# Patient Record
Sex: Female | Born: 1975 | Race: White | Hispanic: No | Smoking: Current every day smoker
Health system: Southern US, Community
[De-identification: ages and names within clinical notes are randomized; demographics above are authoritative.]

## PROBLEM LIST (undated history)

## (undated) DIAGNOSIS — N200 Calculus of kidney: Secondary | ICD-10-CM

## (undated) DIAGNOSIS — F32A Depression, unspecified: Secondary | ICD-10-CM

## (undated) DIAGNOSIS — E119 Type 2 diabetes mellitus without complications: Secondary | ICD-10-CM

## (undated) DIAGNOSIS — F329 Major depressive disorder, single episode, unspecified: Secondary | ICD-10-CM

## (undated) DIAGNOSIS — J42 Unspecified chronic bronchitis: Secondary | ICD-10-CM

## (undated) DIAGNOSIS — F319 Bipolar disorder, unspecified: Secondary | ICD-10-CM

## (undated) DIAGNOSIS — I10 Essential (primary) hypertension: Secondary | ICD-10-CM

## (undated) DIAGNOSIS — G40909 Epilepsy, unspecified, not intractable, without status epilepticus: Secondary | ICD-10-CM

## (undated) DIAGNOSIS — F419 Anxiety disorder, unspecified: Secondary | ICD-10-CM

## (undated) HISTORY — PX: TUBAL LIGATION: SHX77

---

## 1997-11-03 ENCOUNTER — Other Ambulatory Visit: Admission: RE | Admit: 1997-11-03 | Discharge: 1997-11-03 | Payer: Self-pay | Admitting: Obstetrics & Gynecology

## 1997-12-12 ENCOUNTER — Ambulatory Visit (HOSPITAL_COMMUNITY): Admission: RE | Admit: 1997-12-12 | Discharge: 1997-12-12 | Payer: Self-pay | Admitting: *Deleted

## 1998-01-24 ENCOUNTER — Other Ambulatory Visit: Admission: RE | Admit: 1998-01-24 | Discharge: 1998-01-24 | Payer: Self-pay | Admitting: Obstetrics and Gynecology

## 1998-02-17 ENCOUNTER — Inpatient Hospital Stay (HOSPITAL_COMMUNITY): Admission: AD | Admit: 1998-02-17 | Discharge: 1998-02-17 | Payer: Self-pay | Admitting: Obstetrics & Gynecology

## 1998-02-21 ENCOUNTER — Inpatient Hospital Stay (HOSPITAL_COMMUNITY): Admission: AD | Admit: 1998-02-21 | Discharge: 1998-02-24 | Payer: Self-pay | Admitting: Obstetrics & Gynecology

## 1998-04-06 ENCOUNTER — Other Ambulatory Visit: Admission: RE | Admit: 1998-04-06 | Discharge: 1998-04-06 | Payer: Self-pay | Admitting: Obstetrics & Gynecology

## 1998-09-02 ENCOUNTER — Emergency Department (HOSPITAL_COMMUNITY): Admission: EM | Admit: 1998-09-02 | Discharge: 1998-09-02 | Payer: Self-pay | Admitting: Emergency Medicine

## 1998-09-03 ENCOUNTER — Encounter: Payer: Self-pay | Admitting: Emergency Medicine

## 1998-11-14 ENCOUNTER — Emergency Department (HOSPITAL_COMMUNITY): Admission: EM | Admit: 1998-11-14 | Discharge: 1998-11-14 | Payer: Self-pay | Admitting: Emergency Medicine

## 1999-01-21 ENCOUNTER — Inpatient Hospital Stay (HOSPITAL_COMMUNITY): Admission: AD | Admit: 1999-01-21 | Discharge: 1999-01-21 | Payer: Self-pay | Admitting: Obstetrics & Gynecology

## 1999-01-21 ENCOUNTER — Encounter: Payer: Self-pay | Admitting: Obstetrics

## 1999-03-21 ENCOUNTER — Emergency Department (HOSPITAL_COMMUNITY): Admission: EM | Admit: 1999-03-21 | Discharge: 1999-03-21 | Payer: Self-pay | Admitting: Emergency Medicine

## 1999-04-20 ENCOUNTER — Inpatient Hospital Stay (HOSPITAL_COMMUNITY): Admission: AD | Admit: 1999-04-20 | Discharge: 1999-04-20 | Payer: Self-pay | Admitting: Obstetrics and Gynecology

## 1999-04-20 ENCOUNTER — Encounter: Payer: Self-pay | Admitting: Obstetrics and Gynecology

## 1999-07-29 ENCOUNTER — Inpatient Hospital Stay (HOSPITAL_COMMUNITY): Admission: AD | Admit: 1999-07-29 | Discharge: 1999-08-01 | Payer: Self-pay | Admitting: Obstetrics and Gynecology

## 1999-07-30 ENCOUNTER — Encounter: Payer: Self-pay | Admitting: Obstetrics and Gynecology

## 1999-08-03 ENCOUNTER — Inpatient Hospital Stay (HOSPITAL_COMMUNITY): Admission: AD | Admit: 1999-08-03 | Discharge: 1999-08-03 | Payer: Self-pay | Admitting: Obstetrics & Gynecology

## 1999-08-09 ENCOUNTER — Inpatient Hospital Stay (HOSPITAL_COMMUNITY): Admission: AD | Admit: 1999-08-09 | Discharge: 1999-08-09 | Payer: Self-pay | Admitting: Obstetrics & Gynecology

## 1999-08-20 ENCOUNTER — Inpatient Hospital Stay (HOSPITAL_COMMUNITY): Admission: AD | Admit: 1999-08-20 | Discharge: 1999-08-20 | Payer: Self-pay | Admitting: Obstetrics and Gynecology

## 1999-08-27 ENCOUNTER — Inpatient Hospital Stay (HOSPITAL_COMMUNITY): Admission: AD | Admit: 1999-08-27 | Discharge: 1999-08-30 | Payer: Self-pay | Admitting: Obstetrics and Gynecology

## 1999-11-27 ENCOUNTER — Emergency Department (HOSPITAL_COMMUNITY): Admission: EM | Admit: 1999-11-27 | Discharge: 1999-11-27 | Payer: Self-pay | Admitting: Emergency Medicine

## 2000-03-15 ENCOUNTER — Emergency Department (HOSPITAL_COMMUNITY): Admission: EM | Admit: 2000-03-15 | Discharge: 2000-03-15 | Payer: Self-pay | Admitting: Emergency Medicine

## 2000-03-17 ENCOUNTER — Emergency Department (HOSPITAL_COMMUNITY): Admission: EM | Admit: 2000-03-17 | Discharge: 2000-03-17 | Payer: Self-pay | Admitting: Emergency Medicine

## 2000-04-19 ENCOUNTER — Emergency Department (HOSPITAL_COMMUNITY): Admission: EM | Admit: 2000-04-19 | Discharge: 2000-04-19 | Payer: Self-pay | Admitting: Emergency Medicine

## 2000-11-25 ENCOUNTER — Inpatient Hospital Stay (HOSPITAL_COMMUNITY): Admission: AD | Admit: 2000-11-25 | Discharge: 2000-11-25 | Payer: Self-pay | Admitting: Obstetrics

## 2001-08-08 ENCOUNTER — Emergency Department (HOSPITAL_COMMUNITY): Admission: EM | Admit: 2001-08-08 | Discharge: 2001-08-08 | Payer: Self-pay | Admitting: Emergency Medicine

## 2002-02-27 ENCOUNTER — Inpatient Hospital Stay (HOSPITAL_COMMUNITY): Admission: AD | Admit: 2002-02-27 | Discharge: 2002-02-27 | Payer: Self-pay | Admitting: *Deleted

## 2002-02-27 ENCOUNTER — Encounter: Payer: Self-pay | Admitting: Family Medicine

## 2002-06-09 ENCOUNTER — Inpatient Hospital Stay (HOSPITAL_COMMUNITY): Admission: EM | Admit: 2002-06-09 | Discharge: 2002-06-12 | Payer: Self-pay | Admitting: Psychiatry

## 2002-06-09 ENCOUNTER — Encounter: Payer: Self-pay | Admitting: *Deleted

## 2003-10-08 ENCOUNTER — Inpatient Hospital Stay (HOSPITAL_COMMUNITY): Admission: AD | Admit: 2003-10-08 | Discharge: 2003-10-08 | Payer: Self-pay | Admitting: Family Medicine

## 2006-07-21 ENCOUNTER — Inpatient Hospital Stay (HOSPITAL_COMMUNITY): Admission: AD | Admit: 2006-07-21 | Discharge: 2006-07-21 | Payer: Self-pay | Admitting: Obstetrics & Gynecology

## 2006-08-31 ENCOUNTER — Inpatient Hospital Stay (HOSPITAL_COMMUNITY): Admission: AD | Admit: 2006-08-31 | Discharge: 2006-09-04 | Payer: Self-pay | Admitting: Obstetrics and Gynecology

## 2006-12-17 ENCOUNTER — Ambulatory Visit (HOSPITAL_COMMUNITY): Admission: RE | Admit: 2006-12-17 | Discharge: 2006-12-17 | Payer: Self-pay | Admitting: Obstetrics and Gynecology

## 2007-02-03 ENCOUNTER — Inpatient Hospital Stay (HOSPITAL_COMMUNITY): Admission: AD | Admit: 2007-02-03 | Discharge: 2007-02-03 | Payer: Self-pay | Admitting: Obstetrics and Gynecology

## 2007-02-12 ENCOUNTER — Inpatient Hospital Stay (HOSPITAL_COMMUNITY): Admission: AD | Admit: 2007-02-12 | Discharge: 2007-02-14 | Payer: Self-pay | Admitting: Obstetrics and Gynecology

## 2007-02-13 ENCOUNTER — Encounter (INDEPENDENT_AMBULATORY_CARE_PROVIDER_SITE_OTHER): Payer: Self-pay | Admitting: Obstetrics and Gynecology

## 2007-06-18 ENCOUNTER — Encounter: Admission: RE | Admit: 2007-06-18 | Discharge: 2007-06-18 | Payer: Self-pay | Admitting: Family Medicine

## 2007-07-11 ENCOUNTER — Emergency Department (HOSPITAL_COMMUNITY): Admission: EM | Admit: 2007-07-11 | Discharge: 2007-07-11 | Payer: Self-pay | Admitting: Emergency Medicine

## 2008-03-05 ENCOUNTER — Emergency Department (HOSPITAL_COMMUNITY): Admission: EM | Admit: 2008-03-05 | Discharge: 2008-03-06 | Payer: Self-pay | Admitting: Emergency Medicine

## 2008-04-15 ENCOUNTER — Other Ambulatory Visit: Payer: Self-pay

## 2008-04-15 ENCOUNTER — Emergency Department: Payer: Self-pay | Admitting: Emergency Medicine

## 2008-05-03 ENCOUNTER — Emergency Department (HOSPITAL_COMMUNITY): Admission: EM | Admit: 2008-05-03 | Discharge: 2008-05-04 | Payer: Self-pay | Admitting: Emergency Medicine

## 2009-01-31 ENCOUNTER — Emergency Department (HOSPITAL_COMMUNITY): Admission: EM | Admit: 2009-01-31 | Discharge: 2009-02-01 | Payer: Self-pay | Admitting: Emergency Medicine

## 2009-02-16 ENCOUNTER — Emergency Department (HOSPITAL_COMMUNITY): Admission: EM | Admit: 2009-02-16 | Discharge: 2009-02-16 | Payer: Self-pay | Admitting: Emergency Medicine

## 2009-02-18 ENCOUNTER — Emergency Department: Payer: Self-pay | Admitting: Emergency Medicine

## 2010-04-30 ENCOUNTER — Emergency Department (HOSPITAL_COMMUNITY): Admission: EM | Admit: 2010-04-30 | Discharge: 2010-04-30 | Payer: Self-pay | Admitting: Emergency Medicine

## 2010-05-04 ENCOUNTER — Emergency Department (HOSPITAL_COMMUNITY): Admission: EM | Admit: 2010-05-04 | Discharge: 2010-05-04 | Payer: Self-pay | Admitting: Family Medicine

## 2010-08-04 ENCOUNTER — Encounter: Payer: Self-pay | Admitting: Family Medicine

## 2010-09-25 LAB — POCT I-STAT, CHEM 8
BUN: 4 mg/dL — ABNORMAL LOW (ref 6–23)
Calcium, Ion: 1.12 mmol/L (ref 1.12–1.32)
Chloride: 107 mEq/L (ref 96–112)
Creatinine, Ser: 0.7 mg/dL (ref 0.4–1.2)
Glucose, Bld: 91 mg/dL (ref 70–99)
HCT: 40 % (ref 36.0–46.0)
Hemoglobin: 13.6 g/dL (ref 12.0–15.0)
Potassium: 3.7 mEq/L (ref 3.5–5.1)
Sodium: 137 mEq/L (ref 135–145)
TCO2: 20 mmol/L (ref 0–100)

## 2010-09-25 LAB — RAPID URINE DRUG SCREEN, HOSP PERFORMED
Amphetamines: NOT DETECTED
Barbiturates: NOT DETECTED
Benzodiazepines: POSITIVE — AB
Cocaine: NOT DETECTED
Opiates: POSITIVE — AB
Tetrahydrocannabinol: POSITIVE — AB

## 2010-09-25 LAB — POCT PREGNANCY, URINE: Preg Test, Ur: NEGATIVE

## 2010-10-20 LAB — CBC
HCT: 44.1 % (ref 36.0–46.0)
Hemoglobin: 15.2 g/dL — ABNORMAL HIGH (ref 12.0–15.0)
MCHC: 34.5 g/dL (ref 30.0–36.0)
MCV: 97.2 fL (ref 78.0–100.0)
Platelets: 211 10*3/uL (ref 150–400)
RBC: 4.54 MIL/uL (ref 3.87–5.11)
RDW: 15.1 % (ref 11.5–15.5)
WBC: 9.7 10*3/uL (ref 4.0–10.5)

## 2010-10-20 LAB — URINE MICROSCOPIC-ADD ON

## 2010-10-20 LAB — POCT I-STAT, CHEM 8
BUN: 13 mg/dL (ref 6–23)
Calcium, Ion: 1.14 mmol/L (ref 1.12–1.32)
Chloride: 104 mEq/L (ref 96–112)
Creatinine, Ser: 0.7 mg/dL (ref 0.4–1.2)
Glucose, Bld: 101 mg/dL — ABNORMAL HIGH (ref 70–99)
HCT: 47 % — ABNORMAL HIGH (ref 36.0–46.0)
Hemoglobin: 16 g/dL — ABNORMAL HIGH (ref 12.0–15.0)
Potassium: 3.3 mEq/L — ABNORMAL LOW (ref 3.5–5.1)
Sodium: 138 mEq/L (ref 135–145)
TCO2: 25 mmol/L (ref 0–100)

## 2010-10-20 LAB — URINALYSIS, ROUTINE W REFLEX MICROSCOPIC
Glucose, UA: NEGATIVE mg/dL
Hgb urine dipstick: NEGATIVE
Specific Gravity, Urine: 1.038 — ABNORMAL HIGH (ref 1.005–1.030)
pH: 5.5 (ref 5.0–8.0)

## 2010-10-20 LAB — DIFFERENTIAL
Basophils Absolute: 0 10*3/uL (ref 0.0–0.1)
Basophils Relative: 0 % (ref 0–1)
Eosinophils Absolute: 0 10*3/uL (ref 0.0–0.7)
Eosinophils Relative: 0 % (ref 0–5)
Lymphocytes Relative: 5 % — ABNORMAL LOW (ref 12–46)
Lymphs Abs: 0.5 10*3/uL — ABNORMAL LOW (ref 0.7–4.0)
Monocytes Absolute: 0.1 10*3/uL (ref 0.1–1.0)
Monocytes Relative: 2 % — ABNORMAL LOW (ref 3–12)
Neutro Abs: 9.1 10*3/uL — ABNORMAL HIGH (ref 1.7–7.7)
Neutrophils Relative %: 93 % — ABNORMAL HIGH (ref 43–77)

## 2010-10-23 ENCOUNTER — Emergency Department (HOSPITAL_COMMUNITY)
Admission: EM | Admit: 2010-10-23 | Discharge: 2010-10-23 | Disposition: A | Discharge status: Home / Self Care | Payer: Self-pay | Attending: Emergency Medicine | Admitting: Emergency Medicine

## 2010-10-23 DIAGNOSIS — R6883 Chills (without fever): Secondary | ICD-10-CM | POA: Insufficient documentation

## 2010-10-23 DIAGNOSIS — K029 Dental caries, unspecified: Secondary | ICD-10-CM | POA: Insufficient documentation

## 2010-10-23 DIAGNOSIS — K089 Disorder of teeth and supporting structures, unspecified: Secondary | ICD-10-CM | POA: Insufficient documentation

## 2010-11-26 NOTE — Op Note (Signed)
Amanda Valentine, ARMON NO.:  [id number]   MEDICAL RECORD NO.:  [id number]          PATIENT TYPE:  INP   LOCATION:  9141                          FACILITY:  [facility]   PHYSICIAN:  Malva Limes, M.D.    DATE OF BIRTH:  1975-11-26   DATE OF PROCEDURE:  02/13/2007  DATE OF DISCHARGE:                               OPERATIVE REPORT   PREOPERATIVE DIAGNOSIS:  The patient desires postpartum tubal  sterilization.   POSTOPERATIVE DIAGNOSIS:  The patient desires postpartum tubal  sterilization.   PROCEDURE:  Bilateral tubal ligation, Pomeroy procedure.   SURGEON:  Malva Limes, MD.   ANESTHESIA:  Epidural.   ANTIBIOTICS:  Ancef 1 gm.   DRAINS:  None.   ESTIMATED BLOOD LOSS:  Minimal.   SPECIMENS:  Portion of right and left fallopian tubes sent to Pathology.   COMPLICATIONS:  None.   PROCEDURE:  The patient was taken to the operating room where her  epidural anesthetic was reinjected.  Once an adequate level was reached,  the patient was prepped with Betadine and draped in the usual fashion  for this procedure.  Her umbilicus was injected with 0.5% Marcaine, 5 ml  were used.  An infraumbilical incision was then made.  This was carried  down to the fascia, the fascia was entered in the midline and extended  laterally with the Mayo scissors.  The parietal peritoneum was entered  sharply.  The [employer] retractors were then placed.  The patient was  then placed in Trendelenburg.  The right fallopian tube was then grasped  with a Babcock and carried to the fimbriated end for identification.  A  2-cm knuckle was then doubly ligated in the isthmic portion.  The  knuckle was excised, both ostia were visualized, hemostasis appeared to  be adequate.  A similar procedure was performed on the opposite side.  At this point, the parietal peritoneum and fascia were closed using #0  Monocryl suture in a running fashion, the skin was closed using a #4-0  Rapide in a subcuticular  fashion.  The patient tolerated the procedure  well, and she was taken to the recovery room in stable condition.  Instrument and lap counts were correct x1.           ______________________________  Malva Limes, M.D.    MA/MEDQ  D:  02/13/2007  T:  02/13/2007  Job:  [id number]
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
## 2010-11-29 NOTE — Discharge Summary (Signed)
Amanda Valentine, Amanda Valentine NO.:  [id number]   MEDICAL RECORD NO.:  [id number]          PATIENT TYPE:  INP   LOCATION:  9141                          FACILITY:  [facility]   PHYSICIAN:  Malva Limes, M.D.    DATE OF BIRTH:  Jan 29, 1976   DATE OF ADMISSION:  02/12/2007  DATE OF DISCHARGE:  02/14/2007                               DISCHARGE SUMMARY   FINAL DIAGNOSIS:  Intrauterine pregnancy at 36-5/7 weeks' gestation,  intrauterine growth retardation, induction of labor, and the patient  desires permanent sterilization.   PROCEDURE:  Spontaneous vaginal delivery of a female infant with Apgars of  8 and 9.  This was performed by Dr. Malva Limes.  Then there was a  postoperative sterilization procedure performed by Dr. Malva Limes as  well and this went without complication.   COMPLICATIONS:  None.   HISTORY OF PRESENT ILLNESS:  This 35 year old G4, P0-3-0-3 presents at  83 weeks' gestation for induction secondary to IUGR.  The patient's  antepartum course had been uncomplicated.  She was a smoker who quit  during her pregnancy.  The patient is Rh negative and did receive her  RhoGAM as indicated at 28 weeks.  The patient's had ultrasounds towards  the end of her pregnancy watching her growth, and this point a decision  was made to go ahead and have an induction.  The patient also desires  postpartum sterilization.   SUMMARY OF HOSPITAL COURSE:  The patient was admitted on February 12, 2007,  where an induction was begun with Pitocin.  The patient progressed  rapidly to complete and complete and had a spontaneous vaginal delivery  of a 5-pounds 12-ounces female infant with Apgars of 8 and 9.  There was a  nuchal cord times one.  The delivery went without complications.  The  patient was kept n.p.o. because she still desired permanent  sterilization.  She was taken to the operating room on August 2 by Dr.  Malva Limes where a postpartum bilateral tubal ligation was performed  without complications.  The patient's postoperative course was benign,  without any significant fevers, and she was felt ready for discharge on  postpartum day #2 which was postoperative day #1 from her tubal  ligation.   She was sent home on a regular diet, told to decrease activities, told  to continue her vitamins, was given Percocet one to two every 4 hours as  needed for pain, was to follow up in our office in 4 weeks.  Instructions and precautions were reviewed with the patient.  Labs on  discharge:  She had a hemoglobin of 11.6, white blood cell count of  12.7, and platelets of 238,000.  It did not look like the patient  received RhoGAM postpartum.  It appears as the mother is actually Rh  positive.  I do not see the actual labs that were drawn.      Leilani Able, P.A.-C.    ______________________________  Malva Limes, M.D.   MB/MEDQ  D:  03/15/2007  T:  03/15/2007  Job:  [id number]

## 2010-11-29 NOTE — Discharge Summary (Signed)
NAMEEMERLY, PRAK                 ACCOUNT NO.:  [id number]   MEDICAL RECORD NO.:  [id number]          PATIENT TYPE:  INP   LOCATION:  9312                          FACILITY:  [facility]   PHYSICIAN:  Gerrit Friends. Aldona Bar, M.D.   DATE OF BIRTH:  1976/06/03   DATE OF ADMISSION:  08/31/2006  DATE OF DISCHARGE:                               DISCHARGE SUMMARY   DISCHARGE DIAGNOSES:  1. Thirteen-week intrauterine pregnancy, undelivered.  2. Severe back pain, etiology?  - possible viral syndrome with some      resolution.   SUMMARY:  This 35 year old gravida 4, para 3 was admitted by Dr.  Dareen Piano on August 31, 2006, with a sudden onset of colicky low back  pain.  After admission she underwent a thorough evaluation which  included an OB ultrasound which was normal and showed unobstructed  kidneys.  She was maintained first on a morphine PCA which was  eventually changed to Percocet.  She had a urine culture done which was  essentially negative and was ultimately started on Ancef IV which at the  time of discharge was changed to Keflex.  She did have a brief fever  while in the hospital but for the last 36 hours prior to discharge was  totally afebrile.  Her white count never elevated.  GC and chlamydia  were done and were negative.  A comprehensive metabolic profile early on  during the patient's admission was essentially normal and her most  recent CBC was likewise within normal limits.  Her HIV was done and was  nonreactive.  RPR was done and was nonreactive, and hepatitis screening  was done and was negative.  Her urinalysis on admission revealed a trace  of leukocyte esterase but otherwise was essentially negative.  As  mentioned, her urine culture was negative as well.  On the morning of  February 22 she was still complaining of some intermittent back pain but  her vital signs were stable.  She was tolerating a regular diet well and  it seemed as if her pain was controlled with administration  of Motrin  and an occasional Percocet.  She was discharged to home with  prescriptions for albuterol inhaler which she uses p.r.n., Motrin 600 mg  to use every 6 hours as needed for pain, Tylox one to two every 4-6  hours to use for severe pain, and Keflex 500 mg four times a day for the  next 3 days.  She was told to call the office today to schedule  appointment for Dr. Dareen Piano around February 25 or February 26.   The patient's presumptive diagnosis at the time of discharge was that of  a viral syndrome.   She did have a chest x-ray while hospitalized and that was negative as  well.   CONDITION ON DISCHARGE:  Improved.      Gerrit Friends. Aldona Bar, M.D.  Electronically Signed     RMW/MEDQ  D:  09/04/2006  T:  09/04/2006  Job:  [id number]
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
## 2011-04-28 LAB — URINALYSIS, ROUTINE W REFLEX MICROSCOPIC
Glucose, UA: NEGATIVE
Nitrite: NEGATIVE
Protein, ur: NEGATIVE
pH: 6

## 2011-04-28 LAB — CBC
MCHC: 34.3
MCHC: 34.8
MCV: 96.7
Platelets: 230
RBC: 3.49 — ABNORMAL LOW
RDW: 12.8
WBC: 12.7 — ABNORMAL HIGH

## 2011-05-01 LAB — RH IMMUNE GLOBULIN WORKUP (NOT WOMEN'S HOSP): ABO/RH(D): O NEG

## 2012-05-07 ENCOUNTER — Inpatient Hospital Stay (HOSPITAL_COMMUNITY)
Admission: AD | Admit: 2012-05-07 | Discharge: 2012-05-07 | Disposition: A | Discharge status: Home / Self Care | Payer: Medicaid Other | Source: Ambulatory Visit | Attending: Obstetrics & Gynecology | Admitting: Obstetrics & Gynecology

## 2012-05-07 DIAGNOSIS — N643 Galactorrhea not associated with childbirth: Secondary | ICD-10-CM | POA: Insufficient documentation

## 2012-05-07 LAB — URINALYSIS, ROUTINE W REFLEX MICROSCOPIC
Glucose, UA: NEGATIVE mg/dL
Hgb urine dipstick: NEGATIVE
Leukocytes, UA: NEGATIVE
Protein, ur: NEGATIVE mg/dL
pH: 7 (ref 5.0–8.0)

## 2012-05-07 LAB — FOLLICLE STIMULATING HORMONE: FSH: 6.2 m[IU]/mL

## 2012-05-07 LAB — PROLACTIN: Prolactin: 7.5 ng/mL

## 2012-05-07 LAB — POCT PREGNANCY, URINE: Preg Test, Ur: NEGATIVE

## 2012-05-07 LAB — HCG, QUANTITATIVE, PREGNANCY: hCG, Beta Chain, Quant, S: <1 m[IU]/mL (ref <5)

## 2012-05-07 LAB — TSH: TSH: 1.615 u[IU]/mL (ref 0.350–4.500)

## 2012-05-07 NOTE — MAU Provider Note (Signed)
  History     CSN: [id number]  Arrival date and time: 05/07/12 1422   First Provider Initiated Contact with Patient 05/07/12 1649      Chief Complaint  Patient presents with  . Possible Pregnancy   HPI  Amanda Valentine is a 36 y.o. who presents today with 10# wt gain in 1 week and  milk leaking from nipples.  No past medical history on file.  No past surgical history on file.  No family history on file.  History  Substance Use Topics  . Smoking status: Not on file  . Smokeless tobacco: Not on file  . Alcohol Use: Not on file    Allergies:  Allergies  Allergen Reactions  . Orange Juice (Orange Oil) Rash    No prescriptions prior to admission    Review of Systems  Constitutional: Negative for fever and chills.  Eyes: Positive for blurred vision and double vision.  Respiratory: Negative for cough and shortness of breath.   Cardiovascular: Negative for chest pain and palpitations.  Gastrointestinal: Negative for nausea, vomiting, abdominal pain, diarrhea and constipation.  Genitourinary: Negative for dysuria and urgency.  Neurological: Positive for sensory change (increased sense of smell. ) and headaches. Negative for dizziness and tingling.   Physical Exam   Blood pressure 100/63, pulse 63, temperature 97.4 F (36.3 C), temperature source Oral, last menstrual period 04/13/2012, SpO2 100.00%.  Physical Exam  Nursing note and vitals reviewed. Constitutional: She is oriented to person, place, and time. She appears well-developed and well-nourished.  HENT:  Head: Normocephalic.  Cardiovascular: Normal rate.   Respiratory: Effort normal.  GI: Soft. She exhibits distension. There is no tenderness.  Genitourinary: There is breast discharge (milk like discharge expressed bilaterally. ).  Neurological: She is alert and oriented to person, place, and time.  Skin: Skin is warm and dry.    MAU Course  Procedures  Results for orders placed during the hospital  encounter of 05/07/12 (from the past 24 hour(s))  POCT PREGNANCY, URINE     Status: Normal   Collection Time   05/07/12  3:24 PM      Component Value Range   Preg Test, Ur NEGATIVE  NEGATIVE  URINALYSIS, ROUTINE W REFLEX MICROSCOPIC     Status: Abnormal   Collection Time   05/07/12  3:39 PM      Component Value Range   Color, Urine YELLOW  YELLOW   APPearance HAZY (*) CLEAR   Specific Gravity, Urine 1.010  1.005 - 1.030   pH 7.0  5.0 - 8.0   Glucose, UA NEGATIVE  NEGATIVE mg/dL   Hgb urine dipstick NEGATIVE  NEGATIVE   Bilirubin Urine NEGATIVE  NEGATIVE   Ketones, ur NEGATIVE  NEGATIVE mg/dL   Protein, ur NEGATIVE  NEGATIVE mg/dL   Urobilinogen, UA 0.2  0.0 - 1.0 mg/dL   Nitrite NEGATIVE  NEGATIVE   Leukocytes, UA NEGATIVE  NEGATIVE  HCG, QUANTITATIVE, PREGNANCY     Status: Normal   Collection Time   05/07/12  5:01 PM      Component Value Range   hCG, Beta Chain, Quant, S <1  <5 mIU/mL    Assessment and Plan  Galatorrhea TSH, FSH, Prolactin pending Pt will schedule FU with PCP.  Tawnya Crook 05/07/2012, 4:57 PM

## 2012-05-07 NOTE — MAU Note (Signed)
Patient was not in the lobby when called to triage.  

## 2012-05-07 NOTE — MAU Note (Signed)
Patient states she has had irregular periods for the past few months. Has had a tubal ligation 5 years ago. Has been having pregnancy symptoms, milk from breasts, fatigue.

## 2012-10-03 ENCOUNTER — Emergency Department (HOSPITAL_COMMUNITY)
Admission: EM | Admit: 2012-10-03 | Discharge: 2012-10-03 | Disposition: A | Discharge status: Home / Self Care | Payer: Medicaid Other | Attending: Emergency Medicine | Admitting: Emergency Medicine

## 2012-10-03 ENCOUNTER — Emergency Department (HOSPITAL_COMMUNITY): Payer: Medicaid Other

## 2012-10-03 ENCOUNTER — Encounter (HOSPITAL_COMMUNITY): Payer: Self-pay | Admitting: Emergency Medicine

## 2012-10-03 DIAGNOSIS — S0100XA Unspecified open wound of scalp, initial encounter: Secondary | ICD-10-CM | POA: Insufficient documentation

## 2012-10-03 DIAGNOSIS — I10 Essential (primary) hypertension: Secondary | ICD-10-CM | POA: Insufficient documentation

## 2012-10-03 DIAGNOSIS — F17200 Nicotine dependence, unspecified, uncomplicated: Secondary | ICD-10-CM | POA: Insufficient documentation

## 2012-10-03 DIAGNOSIS — S60229A Contusion of unspecified hand, initial encounter: Secondary | ICD-10-CM | POA: Insufficient documentation

## 2012-10-03 DIAGNOSIS — F319 Bipolar disorder, unspecified: Secondary | ICD-10-CM | POA: Insufficient documentation

## 2012-10-03 DIAGNOSIS — S0093XA Contusion of unspecified part of head, initial encounter: Secondary | ICD-10-CM

## 2012-10-03 DIAGNOSIS — Z79899 Other long term (current) drug therapy: Secondary | ICD-10-CM | POA: Insufficient documentation

## 2012-10-03 DIAGNOSIS — E119 Type 2 diabetes mellitus without complications: Secondary | ICD-10-CM | POA: Insufficient documentation

## 2012-10-03 DIAGNOSIS — F411 Generalized anxiety disorder: Secondary | ICD-10-CM | POA: Insufficient documentation

## 2012-10-03 DIAGNOSIS — G40909 Epilepsy, unspecified, not intractable, without status epilepticus: Secondary | ICD-10-CM | POA: Insufficient documentation

## 2012-10-03 DIAGNOSIS — S0003XA Contusion of scalp, initial encounter: Secondary | ICD-10-CM | POA: Insufficient documentation

## 2012-10-03 DIAGNOSIS — S60222A Contusion of left hand, initial encounter: Secondary | ICD-10-CM

## 2012-10-03 HISTORY — DX: Bipolar disorder, unspecified: F31.9

## 2012-10-03 HISTORY — DX: Depression, unspecified: F32.A

## 2012-10-03 HISTORY — DX: Anxiety disorder, unspecified: F41.9

## 2012-10-03 HISTORY — DX: Unspecified chronic bronchitis: J42

## 2012-10-03 HISTORY — DX: Epilepsy, unspecified, not intractable, without status epilepticus: G40.909

## 2012-10-03 HISTORY — DX: Type 2 diabetes mellitus without complications: E11.9

## 2012-10-03 HISTORY — DX: Major depressive disorder, single episode, unspecified: F32.9

## 2012-10-03 HISTORY — DX: Essential (primary) hypertension: I10

## 2012-10-03 MED ORDER — TRAMADOL HCL 50 MG PO TABS: 50.0000 mg | ORAL_TABLET | Freq: Four times a day (QID) | ORAL | Status: DC | PRN

## 2012-10-03 MED ORDER — HYDROCODONE-ACETAMINOPHEN 5-325 MG PO TABS
1.0000 | ORAL_TABLET | Freq: Once | ORAL | Status: AC
  Administered 2012-10-03: 1 via ORAL
  Filled 2012-10-03: qty 1

## 2012-10-03 MED ORDER — ONDANSETRON 4 MG PO TBDP
4.0000 mg | ORAL_TABLET | Freq: Once | ORAL | Status: AC
  Administered 2012-10-03: 4 mg via ORAL
  Filled 2012-10-03: qty 1
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
## 2012-10-06 NOTE — ED Provider Notes (Signed)
Medical screening examination/treatment/procedure(s) were performed by non-physician practitioner and as supervising physician I was immediately available for consultation/collaboration.   [place] Current H Duaine Radin, MD 10/06/12 1543 

## 2013-07-22 ENCOUNTER — Emergency Department: Payer: Self-pay | Admitting: Emergency Medicine

## 2013-07-22 LAB — WET PREP, GENITAL

## 2013-07-22 LAB — URINALYSIS, COMPLETE
BILIRUBIN, UR: NEGATIVE
Blood: NEGATIVE
Glucose,UR: NEGATIVE mg/dL (ref 0–75)
LEUKOCYTE ESTERASE: NEGATIVE
Nitrite: NEGATIVE
Ph: 6 (ref 4.5–8.0)
Protein: NEGATIVE
RBC,UR: 8 /HPF (ref 0–5)
Specific Gravity: 1.025 (ref 1.003–1.030)
Squamous Epithelial: 22
WBC UR: 6 /HPF (ref 0–5)

## 2013-07-22 LAB — GC/CHLAMYDIA PROBE AMP

## 2013-07-24 LAB — URINE CULTURE

## 2013-11-10 ENCOUNTER — Encounter (HOSPITAL_COMMUNITY): Payer: Self-pay | Admitting: Emergency Medicine

## 2013-11-10 ENCOUNTER — Emergency Department (HOSPITAL_COMMUNITY): Payer: Medicaid Other

## 2013-11-10 ENCOUNTER — Emergency Department (HOSPITAL_COMMUNITY)
Admission: EM | Admit: 2013-11-10 | Discharge: 2013-11-10 | Disposition: A | Discharge status: Home / Self Care | Payer: Medicaid Other | Attending: Emergency Medicine | Admitting: Emergency Medicine

## 2013-11-10 DIAGNOSIS — F1210 Cannabis abuse, uncomplicated: Secondary | ICD-10-CM | POA: Insufficient documentation

## 2013-11-10 DIAGNOSIS — S4980XA Other specified injuries of shoulder and upper arm, unspecified arm, initial encounter: Secondary | ICD-10-CM | POA: Insufficient documentation

## 2013-11-10 DIAGNOSIS — G40909 Epilepsy, unspecified, not intractable, without status epilepticus: Secondary | ICD-10-CM | POA: Diagnosis not present

## 2013-11-10 DIAGNOSIS — F1310 Sedative, hypnotic or anxiolytic abuse, uncomplicated: Secondary | ICD-10-CM | POA: Insufficient documentation

## 2013-11-10 DIAGNOSIS — Y9389 Activity, other specified: Secondary | ICD-10-CM | POA: Diagnosis not present

## 2013-11-10 DIAGNOSIS — S46909A Unspecified injury of unspecified muscle, fascia and tendon at shoulder and upper arm level, unspecified arm, initial encounter: Secondary | ICD-10-CM | POA: Insufficient documentation

## 2013-11-10 DIAGNOSIS — S199XXA Unspecified injury of neck, initial encounter: Secondary | ICD-10-CM | POA: Diagnosis present

## 2013-11-10 DIAGNOSIS — I10 Essential (primary) hypertension: Secondary | ICD-10-CM | POA: Insufficient documentation

## 2013-11-10 DIAGNOSIS — F411 Generalized anxiety disorder: Secondary | ICD-10-CM | POA: Insufficient documentation

## 2013-11-10 DIAGNOSIS — Y92410 Unspecified street and highway as the place of occurrence of the external cause: Secondary | ICD-10-CM | POA: Diagnosis not present

## 2013-11-10 DIAGNOSIS — Z8709 Personal history of other diseases of the respiratory system: Secondary | ICD-10-CM | POA: Insufficient documentation

## 2013-11-10 DIAGNOSIS — Z79899 Other long term (current) drug therapy: Secondary | ICD-10-CM | POA: Diagnosis not present

## 2013-11-10 DIAGNOSIS — S0993XA Unspecified injury of face, initial encounter: Secondary | ICD-10-CM | POA: Diagnosis present

## 2013-11-10 DIAGNOSIS — F17200 Nicotine dependence, unspecified, uncomplicated: Secondary | ICD-10-CM | POA: Insufficient documentation

## 2013-11-10 DIAGNOSIS — T148XXA Other injury of unspecified body region, initial encounter: Secondary | ICD-10-CM

## 2013-11-10 DIAGNOSIS — IMO0002 Reserved for concepts with insufficient information to code with codable children: Secondary | ICD-10-CM | POA: Insufficient documentation

## 2013-11-10 DIAGNOSIS — R4789 Other speech disturbances: Secondary | ICD-10-CM | POA: Diagnosis not present

## 2013-11-10 DIAGNOSIS — E119 Type 2 diabetes mellitus without complications: Secondary | ICD-10-CM | POA: Insufficient documentation

## 2013-11-10 DIAGNOSIS — F319 Bipolar disorder, unspecified: Secondary | ICD-10-CM | POA: Insufficient documentation

## 2013-11-10 LAB — BASIC METABOLIC PANEL
BUN: 7 mg/dL (ref 6–23)
CO2: 23 mEq/L (ref 19–32)
Calcium: 9.1 mg/dL (ref 8.4–10.5)
Chloride: 103 mEq/L (ref 96–112)
Creatinine, Ser: 0.61 mg/dL (ref 0.50–1.10)
Glucose, Bld: 79 mg/dL (ref 70–99)
POTASSIUM: 3.5 meq/L — AB (ref 3.7–5.3)
SODIUM: 140 meq/L (ref 137–147)

## 2013-11-10 LAB — RAPID URINE DRUG SCREEN, HOSP PERFORMED
AMPHETAMINES: NOT DETECTED
Barbiturates: NOT DETECTED
Benzodiazepines: POSITIVE — AB
Cocaine: NOT DETECTED
Opiates: NOT DETECTED
TETRAHYDROCANNABINOL: POSITIVE — AB

## 2013-11-10 LAB — ETHANOL: Alcohol, Ethyl (B): <11 mg/dL (ref 0–11)

## 2013-11-10 LAB — VALPROIC ACID LEVEL: Valproic Acid Lvl: 46 ug/mL — ABNORMAL LOW (ref 50.0–100.0)

## 2013-11-10 MED ORDER — IBUPROFEN 800 MG PO TABS
800.0000 mg | ORAL_TABLET | Freq: Once | ORAL | Status: AC
  Administered 2013-11-10: 800 mg via ORAL
  Filled 2013-11-10: qty 1

## 2013-11-10 MED ORDER — CYCLOBENZAPRINE HCL 10 MG PO TABS
10.0000 mg | ORAL_TABLET | Freq: Once | ORAL | Status: AC
  Administered 2013-11-10: 10 mg via ORAL
  Filled 2013-11-10: qty 1
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
## 2013-11-10 NOTE — ED Notes (Signed)
Attempted to call patient about belongings. Phone disconnected.

## 2013-11-10 NOTE — ED Notes (Signed)
Pt. Crying stating "I need something for pain. I have ibuprofen and flexeril at home". Explained to patient reasoning for this medication. Pt. Continues to state she is unhappy with visit and that she needs to be admitted.
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
## 2013-11-10 NOTE — ED Notes (Signed)
Pt. Left jewelry (one wedding band, one engagement ring, and necklace). Locked up with security.
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
## 2013-11-11 NOTE — ED Provider Notes (Signed)
Medical screening examination/treatment/procedure(s) were performed by non-physician practitioner and as supervising physician I was immediately available for consultation/collaboration.  Thomasene Dubow L Brinlynn Gorton, MD 11/11/13 0027 

## 2014-02-14 ENCOUNTER — Emergency Department (HOSPITAL_COMMUNITY)
Admission: EM | Admit: 2014-02-14 | Discharge: 2014-02-14 | Disposition: A | Discharge status: Home / Self Care | Payer: Medicaid Other | Attending: Emergency Medicine | Admitting: Emergency Medicine

## 2014-02-14 ENCOUNTER — Encounter (HOSPITAL_COMMUNITY): Payer: Self-pay | Admitting: Emergency Medicine

## 2014-02-14 ENCOUNTER — Emergency Department (HOSPITAL_COMMUNITY): Payer: Medicaid Other

## 2014-02-14 DIAGNOSIS — Z8709 Personal history of other diseases of the respiratory system: Secondary | ICD-10-CM | POA: Diagnosis not present

## 2014-02-14 DIAGNOSIS — I10 Essential (primary) hypertension: Secondary | ICD-10-CM | POA: Diagnosis not present

## 2014-02-14 DIAGNOSIS — R479 Unspecified speech disturbances: Secondary | ICD-10-CM

## 2014-02-14 DIAGNOSIS — Z3202 Encounter for pregnancy test, result negative: Secondary | ICD-10-CM | POA: Diagnosis not present

## 2014-02-14 DIAGNOSIS — E119 Type 2 diabetes mellitus without complications: Secondary | ICD-10-CM | POA: Diagnosis not present

## 2014-02-14 DIAGNOSIS — R569 Unspecified convulsions: Secondary | ICD-10-CM | POA: Diagnosis present

## 2014-02-14 DIAGNOSIS — F319 Bipolar disorder, unspecified: Secondary | ICD-10-CM | POA: Diagnosis not present

## 2014-02-14 DIAGNOSIS — F411 Generalized anxiety disorder: Secondary | ICD-10-CM | POA: Diagnosis not present

## 2014-02-14 DIAGNOSIS — Z79899 Other long term (current) drug therapy: Secondary | ICD-10-CM | POA: Diagnosis not present

## 2014-02-14 DIAGNOSIS — F17200 Nicotine dependence, unspecified, uncomplicated: Secondary | ICD-10-CM | POA: Insufficient documentation

## 2014-02-14 DIAGNOSIS — G40909 Epilepsy, unspecified, not intractable, without status epilepticus: Secondary | ICD-10-CM | POA: Diagnosis not present

## 2014-02-14 DIAGNOSIS — R4789 Other speech disturbances: Secondary | ICD-10-CM | POA: Insufficient documentation

## 2014-02-14 LAB — RAPID URINE DRUG SCREEN, HOSP PERFORMED
Amphetamines: NOT DETECTED
Barbiturates: NOT DETECTED
Benzodiazepines: POSITIVE — AB
Cocaine: NOT DETECTED
Opiates: NOT DETECTED
Tetrahydrocannabinol: POSITIVE — AB

## 2014-02-14 LAB — BASIC METABOLIC PANEL
Anion gap: 18 — ABNORMAL HIGH (ref 5–15)
BUN: 13 mg/dL (ref 6–23)
CO2: 18 mEq/L — ABNORMAL LOW (ref 19–32)
Calcium: 9.4 mg/dL (ref 8.4–10.5)
Chloride: 102 mEq/L (ref 96–112)
Creatinine, Ser: 0.57 mg/dL (ref 0.50–1.10)
GFR calc Af Amer: >90 mL/min (ref >90)
GFR calc non Af Amer: >90 mL/min (ref >90)
Glucose, Bld: 90 mg/dL (ref 70–99)
Potassium: 3.8 mEq/L (ref 3.7–5.3)
Sodium: 138 mEq/L (ref 137–147)

## 2014-02-14 LAB — CBC WITH DIFFERENTIAL/PLATELET
Basophils Absolute: 0 10*3/uL (ref 0.0–0.1)
Basophils Relative: 0 % (ref 0–1)
Eosinophils Absolute: 0 10*3/uL (ref 0.0–0.7)
Eosinophils Relative: 0 % (ref 0–5)
HCT: 37.3 % (ref 36.0–46.0)
Hemoglobin: 13.6 g/dL (ref 12.0–15.0)
Lymphocytes Relative: 18 % (ref 12–46)
Lymphs Abs: 1.4 10*3/uL (ref 0.7–4.0)
MCH: 32.9 pg (ref 26.0–34.0)
MCHC: 36.5 g/dL — ABNORMAL HIGH (ref 30.0–36.0)
MCV: 90.3 fL (ref 78.0–100.0)
Monocytes Absolute: 0.5 10*3/uL (ref 0.1–1.0)
Monocytes Relative: 6 % (ref 3–12)
Neutro Abs: 5.8 10*3/uL (ref 1.7–7.7)
Neutrophils Relative %: 76 % (ref 43–77)
Platelets: 177 10*3/uL (ref 150–400)
RBC: 4.13 MIL/uL (ref 3.87–5.11)
RDW: 12 % (ref 11.5–15.5)
WBC: 7.7 10*3/uL (ref 4.0–10.5)

## 2014-02-14 LAB — URINE MICROSCOPIC-ADD ON

## 2014-02-14 LAB — URINALYSIS, ROUTINE W REFLEX MICROSCOPIC
Glucose, UA: NEGATIVE mg/dL
Ketones, ur: >80 mg/dL — AB
Leukocytes, UA: NEGATIVE
Nitrite: NEGATIVE
Protein, ur: 30 mg/dL — AB
Specific Gravity, Urine: 1.027 (ref 1.005–1.030)
Urobilinogen, UA: 1 mg/dL (ref 0.0–1.0)
pH: 6 (ref 5.0–8.0)

## 2014-02-14 LAB — PREGNANCY, URINE: Preg Test, Ur: NEGATIVE

## 2014-02-14 LAB — VALPROIC ACID LEVEL: Valproic Acid Lvl: 38.4 ug/mL — ABNORMAL LOW (ref 50.0–100.0)

## 2014-02-14 LAB — ETHANOL: Alcohol, Ethyl (B): <11 mg/dL (ref 0–11)

## 2014-02-14 MED ORDER — QUETIAPINE FUMARATE 25 MG PO TABS
75.0000 mg | ORAL_TABLET | Freq: Once | ORAL | Status: AC
  Administered 2014-02-14: 75 mg via ORAL
  Filled 2014-02-14: qty 3

## 2014-02-14 MED ORDER — QUETIAPINE FUMARATE ER 150 MG PO TB24: 75.0000 mg | ORAL_TABLET | Freq: Every day | ORAL | Status: DC

## 2014-02-14 MED ORDER — SODIUM CHLORIDE 0.9 % IV BOLUS (SEPSIS)
1000.0000 mL | Freq: Once | INTRAVENOUS | Status: AC
  Administered 2014-02-14: 1000 mL via INTRAVENOUS

## 2014-02-14 MED ORDER — HYDROCODONE-ACETAMINOPHEN 5-325 MG PO TABS
1.0000 | ORAL_TABLET | Freq: Once | ORAL | Status: AC
  Administered 2014-02-14: 1 via ORAL
  Filled 2014-02-14: qty 1

## 2014-02-14 MED ORDER — DIVALPROEX SODIUM ER 500 MG PO TB24
500.0000 mg | ORAL_TABLET | Freq: Every day | ORAL | Status: DC
  Administered 2014-02-14: 500 mg via ORAL
  Filled 2014-02-14: qty 1

## 2014-02-14 MED ORDER — LORAZEPAM 2 MG/ML IJ SOLN
1.0000 mg | Freq: Once | INTRAMUSCULAR | Status: AC
  Administered 2014-02-14: 1 mg via INTRAVENOUS
  Filled 2014-02-14: qty 1

## 2014-02-14 NOTE — ED Provider Notes (Signed)
CSN: [id number]     Arrival date & time 02/14/14  1207 History   First MD Initiated Contact with Patient 02/14/14 1525     Chief Complaint  Patient presents with  . Seizures  . Anxiety     (Consider location/radiation/quality/duration/timing/severity/associated sxs/prior Treatment) HPI Patient presents emergency department complaining of anxiety and difficulty speaking.  The patient, states she's having trouble talking, and she is scared because of this the patient, states, that early this morning.  She thinks may have been several seizures today.  Patient does not give me a clear history of what happened today.  She states that she is upset about the fact, that she's having trouble talking the patient denies chest pain, shortness of breath, weakness, dizziness, numbness, headache, blurred vision, nausea, vomiting, diarrhea, abdominal pain, or syncope.  The patient, states, that she did not take any medications prior to arrival Past Medical History  Diagnosis Date  . Epilepsy   . Bipolar 1 disorder   . Anxiety   . Depression   . Chronic bronchitis   . Diabetes mellitus without complication   . Hypertension    Past Surgical History  Procedure Laterality Date  . Tubal ligation     No family history on file. History  Substance Use Topics  . Smoking status: Current Every Day Smoker -- 0.50 packs/day    Types: Cigarettes  . Smokeless tobacco: Not on file  . Alcohol Use: Yes     Comment: occasionally   OB History   Grav Para Term Preterm Abortions TAB SAB Ect Mult Living                 Review of Systems  All other systems negative except as documented in the HPI. All pertinent positives and negatives as reviewed in the HPI.  Allergies  Orange juice  Home Medications   Prior to Admission medications   Medication Sig Start Date End Date Taking? Authorizing Provider  albuterol (PROVENTIL) (2.5 MG/3ML) 0.083% nebulizer solution Take 2.5 mg by nebulization every 6 (six) hours  as needed for wheezing.   Yes Historical Provider, MD  ALPRAZolam Prudy Feeler) 1 MG tablet Take 1 mg by mouth 3 (three) times daily as needed. For anxiety   Yes Historical Provider, MD  divalproex (DEPAKOTE ER) 500 MG 24 hr tablet Take 500 mg by mouth at bedtime.   Yes Historical Provider, MD  QUEtiapine Fumarate (SEROQUEL XR) 150 MG 24 hr tablet Take 75 mg by mouth at bedtime.   Yes Historical Provider, MD  tiotropium (SPIRIVA) 18 MCG inhalation capsule Place 18 mcg into inhaler and inhale daily.   Yes Historical Provider, MD   BP 92/36  Pulse 75  Temp(Src) 98.2 F (36.8 C) (Oral)  Resp 14  SpO2 98%  LMP 01/25/2014 Physical Exam  Constitutional: She is oriented to person, place, and time. Vital signs are normal. She appears well-developed and well-nourished. No distress.  HENT:  Head: Normocephalic and atraumatic.  Mouth/Throat: Oropharynx is clear and moist.  Eyes: Pupils are equal, round, and reactive to light.  Neck: Normal range of motion. Neck supple.  Cardiovascular: Normal rate, regular rhythm and normal heart sounds.  Exam reveals no gallop and no friction rub.   No murmur heard. Pulmonary/Chest: Effort normal and breath sounds normal. No respiratory distress.  Neurological: She is alert and oriented to person, place, and time. She has normal strength. No sensory deficit. She exhibits normal muscle tone. Coordination and gait normal. GCS eye subscore is 4. GCS  verbal subscore is 5. GCS motor subscore is 6.  Skin: Skin is warm and dry. No erythema.    ED Course  Procedures (including critical care time) Labs Review Labs Reviewed  CBC WITH DIFFERENTIAL - Abnormal; Notable for the following:    MCHC 36.5 (*)    All other components within normal limits  BASIC METABOLIC PANEL - Abnormal; Notable for the following:    CO2 18 (*)    Anion gap 18 (*)    All other components within normal limits  URINALYSIS, ROUTINE W REFLEX MICROSCOPIC - Abnormal; Notable for the following:     APPearance CLOUDY (*)    Hgb urine dipstick MODERATE (*)    Bilirubin Urine SMALL (*)    Ketones, ur >80 (*)    Protein, ur 30 (*)    All other components within normal limits  VALPROIC ACID LEVEL - Abnormal; Notable for the following:    Valproic Acid Lvl 38.4 (*)    All other components within normal limits  URINE RAPID DRUG SCREEN (HOSP PERFORMED) - Abnormal; Notable for the following:    Benzodiazepines POSITIVE (*)    Tetrahydrocannabinol POSITIVE (*)    All other components within normal limits  URINE MICROSCOPIC-ADD ON - Abnormal; Notable for the following:    Squamous Epithelial / LPF MANY (*)    Bacteria, UA FEW (*)    All other components within normal limits  URINE CULTURE  PREGNANCY, URINE  ETHANOL    Imaging Review Ct Head Wo Contrast  02/14/2014   CLINICAL DATA:  History of seizure.  Speech problems  EXAM: CT HEAD WITHOUT CONTRAST  TECHNIQUE: Contiguous axial images were obtained from the base of the skull through the vertex without intravenous contrast.  COMPARISON:  CT head 11/10/2013  FINDINGS: Ventricle size is normal. Negative for acute or chronic infarction. Negative for hemorrhage or fluid collection. Negative for mass or edema. No shift of the midline structures.  Calvarium is intact.  IMPRESSION: Normal   Electronically Signed   By: Marlan Palauharles  Clark M.D.   On: 02/14/2014 18:01     Patient most likely has had an anxiety type reaction.  Show had a seizure in the emergency department with difficulty talking clears the more that you talk to her I advised the patient.  Followup with her primary care Dr. told to return here as needed.  Patient, states he like to go home.  She has had food to eat and had something to drink without difficulty.  Carlyle Dollyhristopher W Evony Rezek, PA-C 02/14/14 [phone]

## 2014-02-14 NOTE — ED Notes (Signed)
Pt returned from xray

## 2014-02-14 NOTE — ED Notes (Signed)
Pt reports "pain all over entire body from seizures all day yesterday and today"

## 2014-02-14 NOTE — ED Notes (Signed)
Pt crying and anxious upon arrival to ED. No signs acute distress noted at the time.

## 2014-02-14 NOTE — Discharge Instructions (Signed)
Return here as needed. Follow up with your doctor. °

## 2014-02-14 NOTE — ED Notes (Addendum)
Pt presents to department via [facility] from home for evaluation of possible seizure and anxiety. History of severe anxiety. Mother states several episodes of seizures last night and today. Upon arrival pt is conscious alert and oriented x4. Able to answer questions appropriately. CBG 88.

## 2014-02-14 NOTE — ED Notes (Signed)
Pt states she's feeling better. No slurred speech or weakness noted. Pt requesting to drink and eat. Pt sitting upright without difficulty swallowing at this time.

## 2014-02-14 NOTE — ED Notes (Signed)
Pt alert, talking with family at the time. No signs of distress noted.

## 2014-02-16 NOTE — ED Provider Notes (Signed)
Medical screening examination/treatment/procedure(s) were conducted as a shared visit with non-physician practitioner(s) and myself.  I personally evaluated the patient during the encounter.   EKG Interpretation None      Patient here with episodes of, "hot, cold, vomit, seizure." On multiple seizure meds. Also having intermittent dysarthria with no slurred speech, but more of a baby-talk type speech during her spells. No cranial nerve deficits, no strength or sensation deficits. States multiple seizures throughout the evening. With seizures, no concern for stroke, states she's had episodes like this before. Labs with mildly subtherapeutic depakote, no need for loading dose, insructed to take normal dose as prescribed. I think there is also a large psych component to her symptomatology. Stable for discharge.  Elwin MochaBlair Rufus Cypert, MD 02/16/14 1302

## 2014-02-17 LAB — URINE CULTURE: Colony Count: 40000

## 2014-02-18 NOTE — Progress Notes (Signed)
ED Antimicrobial Stewardship Positive Culture Follow Up   Amanda Valentine is an 38 y.o. female who [facility] with a chief complaint of  Chief Complaint  Patient presents with  . Seizures  . Anxiety    Recent Results (from the past 720 hour(s))  URINE CULTURE     Status: None   Collection Time    02/14/14  4:10 PM      Result Value Ref Range Status   Specimen Description URINE, RANDOM   Final   Special Requests NONE   Final   Culture  Setup Time     Final   Value: 02/14/2014 17:17     Performed at [facility]   Colony Count     Final   Value: 40,000 COLONIES/ML     Performed at [facility]   Culture     Final   Value: STAPHYLOCOCCUS SPECIES (COAGULASE NEGATIVE)     Note: RIFAMPIN AND GENTAMICIN SHOULD NOT BE USED AS SINGLE DRUGS FOR TREATMENT OF STAPH INFECTIONS.     Performed at [facility]   Report Status 02/17/2014 FINAL   Final   Organism ID, Bacteria STAPHYLOCOCCUS SPECIES (COAGULASE NEGATIVE)   Final    Asymptomatic bacteriuria - urine micro contains squamous cells, patient has no urinary symptoms, colony count is below threshold for treatment, organism is a common member of skin flora.  Plan: No treatment.  ED Provider: Junius FinnerErin O'Malley, PA-C   Sallee Provencalurner, [name] 02/18/2014, 9:55 AM Infectious Diseases Pharmacist Phone# [phone]

## 2015-01-26 ENCOUNTER — Encounter (HOSPITAL_COMMUNITY): Payer: Self-pay | Admitting: Physical Medicine and Rehabilitation

## 2015-01-26 ENCOUNTER — Emergency Department (HOSPITAL_COMMUNITY)
Admission: EM | Admit: 2015-01-26 | Discharge: 2015-01-26 | Disposition: A | Discharge status: Home / Self Care | Payer: Medicaid Other | Attending: Emergency Medicine | Admitting: Emergency Medicine

## 2015-01-26 ENCOUNTER — Emergency Department (HOSPITAL_COMMUNITY): Payer: Medicaid Other

## 2015-01-26 DIAGNOSIS — Y998 Other external cause status: Secondary | ICD-10-CM | POA: Insufficient documentation

## 2015-01-26 DIAGNOSIS — I10 Essential (primary) hypertension: Secondary | ICD-10-CM | POA: Insufficient documentation

## 2015-01-26 DIAGNOSIS — Y9389 Activity, other specified: Secondary | ICD-10-CM | POA: Diagnosis not present

## 2015-01-26 DIAGNOSIS — E119 Type 2 diabetes mellitus without complications: Secondary | ICD-10-CM | POA: Insufficient documentation

## 2015-01-26 DIAGNOSIS — Z8669 Personal history of other diseases of the nervous system and sense organs: Secondary | ICD-10-CM | POA: Insufficient documentation

## 2015-01-26 DIAGNOSIS — S299XXA Unspecified injury of thorax, initial encounter: Secondary | ICD-10-CM | POA: Insufficient documentation

## 2015-01-26 DIAGNOSIS — S199XXA Unspecified injury of neck, initial encounter: Secondary | ICD-10-CM | POA: Diagnosis present

## 2015-01-26 DIAGNOSIS — M542 Cervicalgia: Secondary | ICD-10-CM

## 2015-01-26 DIAGNOSIS — Z720 Tobacco use: Secondary | ICD-10-CM | POA: Insufficient documentation

## 2015-01-26 DIAGNOSIS — F319 Bipolar disorder, unspecified: Secondary | ICD-10-CM | POA: Diagnosis not present

## 2015-01-26 DIAGNOSIS — Z8709 Personal history of other diseases of the respiratory system: Secondary | ICD-10-CM | POA: Insufficient documentation

## 2015-01-26 DIAGNOSIS — S0990XA Unspecified injury of head, initial encounter: Secondary | ICD-10-CM | POA: Insufficient documentation

## 2015-01-26 DIAGNOSIS — Y92410 Unspecified street and highway as the place of occurrence of the external cause: Secondary | ICD-10-CM | POA: Insufficient documentation

## 2015-01-26 DIAGNOSIS — F419 Anxiety disorder, unspecified: Secondary | ICD-10-CM | POA: Insufficient documentation

## 2015-01-26 MED ORDER — CYCLOBENZAPRINE HCL 10 MG PO TABS: 10.0000 mg | ORAL_TABLET | Freq: Two times a day (BID) | ORAL | Status: DC | PRN

## 2015-01-26 MED ORDER — HYDROCODONE-ACETAMINOPHEN 5-325 MG PO TABS
1.0000 | ORAL_TABLET | Freq: Once | ORAL | Status: AC
  Administered 2015-01-26: 1 via ORAL
  Filled 2015-01-26: qty 1

## 2015-01-26 MED ORDER — IBUPROFEN 800 MG PO TABS: 800.0000 mg | ORAL_TABLET | Freq: Three times a day (TID) | ORAL | Status: DC
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
## 2015-11-05 ENCOUNTER — Encounter (HOSPITAL_COMMUNITY): Payer: Self-pay | Admitting: Emergency Medicine

## 2015-11-05 ENCOUNTER — Emergency Department (HOSPITAL_COMMUNITY)
Admission: EM | Admit: 2015-11-05 | Discharge: 2015-11-05 | Disposition: A | Discharge status: Home / Self Care | Payer: Medicaid Other | Attending: Emergency Medicine | Admitting: Emergency Medicine

## 2015-11-05 DIAGNOSIS — I10 Essential (primary) hypertension: Secondary | ICD-10-CM | POA: Diagnosis not present

## 2015-11-05 DIAGNOSIS — F17210 Nicotine dependence, cigarettes, uncomplicated: Secondary | ICD-10-CM | POA: Diagnosis not present

## 2015-11-05 DIAGNOSIS — R109 Unspecified abdominal pain: Secondary | ICD-10-CM | POA: Diagnosis not present

## 2015-11-05 DIAGNOSIS — E119 Type 2 diabetes mellitus without complications: Secondary | ICD-10-CM | POA: Diagnosis not present

## 2015-11-05 LAB — CBC
HCT: 42.5 % (ref 36.0–46.0)
Hemoglobin: 14.7 g/dL (ref 12.0–15.0)
MCH: 31.8 pg (ref 26.0–34.0)
MCHC: 34.6 g/dL (ref 30.0–36.0)
MCV: 92 fL (ref 78.0–100.0)
PLATELETS: 311 10*3/uL (ref 150–400)
RBC: 4.62 MIL/uL (ref 3.87–5.11)
RDW: 12.9 % (ref 11.5–15.5)
WBC: 8.9 10*3/uL (ref 4.0–10.5)

## 2015-11-05 LAB — URINE MICROSCOPIC-ADD ON

## 2015-11-05 LAB — COMPREHENSIVE METABOLIC PANEL
ALBUMIN: 4 g/dL (ref 3.5–5.0)
ALT: 36 U/L (ref 14–54)
AST: 35 U/L (ref 15–41)
Alkaline Phosphatase: 54 U/L (ref 38–126)
Anion gap: 11 (ref 5–15)
BUN: 14 mg/dL (ref 6–20)
CALCIUM: 10.1 mg/dL (ref 8.9–10.3)
CO2: 23 mmol/L (ref 22–32)
CREATININE: 0.83 mg/dL (ref 0.44–1.00)
Chloride: 99 mmol/L — ABNORMAL LOW (ref 101–111)
GFR calc non Af Amer: >60 mL/min (ref >60)
GLUCOSE: 111 mg/dL — AB (ref 65–99)
Potassium: 3.6 mmol/L (ref 3.5–5.1)
Sodium: 133 mmol/L — ABNORMAL LOW (ref 135–145)
Total Bilirubin: 0.8 mg/dL (ref 0.3–1.2)
Total Protein: 7 g/dL (ref 6.5–8.1)

## 2015-11-05 LAB — URINALYSIS, ROUTINE W REFLEX MICROSCOPIC
Glucose, UA: NEGATIVE mg/dL
KETONES UR: 15 mg/dL — AB
Nitrite: POSITIVE — AB
Protein, ur: 30 mg/dL — AB
Specific Gravity, Urine: 1.029 (ref 1.005–1.030)
pH: 5.5 (ref 5.0–8.0)

## 2015-11-05 LAB — I-STAT BETA HCG BLOOD, ED (MC, WL, AP ONLY): I-stat hCG, quantitative: <5 m[IU]/mL (ref <5)

## 2015-11-05 LAB — LIPASE, BLOOD: Lipase: 24 U/L (ref 11–51)

## 2015-11-05 NOTE — ED Notes (Addendum)
Pt reports right flank pain radiating around to front of abd x 6 months which has recently worsened. Pt reports that she had a US done at her PCP and they saw numerous kidney stones. Pt alert x4. NAD at this time.

## 2015-11-05 NOTE — ED Notes (Signed)
Pt states that she is leaving and going to call the ambulance to be seen quicker. Explained triage process. Still states she is leaving.

## 2015-11-26 ENCOUNTER — Emergency Department (HOSPITAL_COMMUNITY): Payer: Medicaid Other

## 2015-11-26 ENCOUNTER — Encounter (HOSPITAL_COMMUNITY): Payer: Self-pay

## 2015-11-26 ENCOUNTER — Inpatient Hospital Stay (HOSPITAL_COMMUNITY)
Admission: EM | Admit: 2015-11-26 | Discharge: 2015-11-27 | DRG: 101 | Discharge status: Against Medical Advice | Payer: Medicaid Other | Attending: Family Medicine | Admitting: Family Medicine

## 2015-11-26 DIAGNOSIS — F1210 Cannabis abuse, uncomplicated: Secondary | ICD-10-CM | POA: Diagnosis present

## 2015-11-26 DIAGNOSIS — F419 Anxiety disorder, unspecified: Secondary | ICD-10-CM | POA: Diagnosis present

## 2015-11-26 DIAGNOSIS — Z794 Long term (current) use of insulin: Secondary | ICD-10-CM

## 2015-11-26 DIAGNOSIS — K922 Gastrointestinal hemorrhage, unspecified: Secondary | ICD-10-CM | POA: Diagnosis present

## 2015-11-26 DIAGNOSIS — G8929 Other chronic pain: Secondary | ICD-10-CM | POA: Diagnosis present

## 2015-11-26 DIAGNOSIS — R569 Unspecified convulsions: Secondary | ICD-10-CM

## 2015-11-26 DIAGNOSIS — I10 Essential (primary) hypertension: Secondary | ICD-10-CM | POA: Diagnosis present

## 2015-11-26 DIAGNOSIS — F17210 Nicotine dependence, cigarettes, uncomplicated: Secondary | ICD-10-CM | POA: Diagnosis present

## 2015-11-26 DIAGNOSIS — E119 Type 2 diabetes mellitus without complications: Secondary | ICD-10-CM | POA: Diagnosis present

## 2015-11-26 DIAGNOSIS — J449 Chronic obstructive pulmonary disease, unspecified: Secondary | ICD-10-CM | POA: Diagnosis present

## 2015-11-26 DIAGNOSIS — Z79899 Other long term (current) drug therapy: Secondary | ICD-10-CM

## 2015-11-26 DIAGNOSIS — Z880 Allergy status to penicillin: Secondary | ICD-10-CM

## 2015-11-26 DIAGNOSIS — F319 Bipolar disorder, unspecified: Secondary | ICD-10-CM | POA: Diagnosis present

## 2015-11-26 DIAGNOSIS — F1010 Alcohol abuse, uncomplicated: Secondary | ICD-10-CM | POA: Diagnosis present

## 2015-11-26 DIAGNOSIS — G40909 Epilepsy, unspecified, not intractable, without status epilepticus: Principal | ICD-10-CM | POA: Diagnosis present

## 2015-11-26 LAB — CBC WITH DIFFERENTIAL/PLATELET
Basophils Absolute: 0 10*3/uL (ref 0.0–0.1)
Basophils Relative: 1 %
EOS ABS: 0.1 10*3/uL (ref 0.0–0.7)
EOS PCT: 3 %
HCT: 33.5 % — ABNORMAL LOW (ref 36.0–46.0)
Hemoglobin: 11.7 g/dL — ABNORMAL LOW (ref 12.0–15.0)
Lymphocytes Relative: 58 %
Lymphs Abs: 2.3 10*3/uL (ref 0.7–4.0)
MCH: 32 pg (ref 26.0–34.0)
MCHC: 34.9 g/dL (ref 30.0–36.0)
MCV: 91.5 fL (ref 78.0–100.0)
Monocytes Absolute: 0.3 10*3/uL (ref 0.1–1.0)
Monocytes Relative: 8 %
Neutro Abs: 1.2 10*3/uL — ABNORMAL LOW (ref 1.7–7.7)
Neutrophils Relative %: 30 %
Platelets: 196 10*3/uL (ref 150–400)
RBC: 3.66 MIL/uL — ABNORMAL LOW (ref 3.87–5.11)
RDW: 12.9 % (ref 11.5–15.5)
WBC: 3.9 10*3/uL — ABNORMAL LOW (ref 4.0–10.5)

## 2015-11-26 LAB — I-STAT CHEM 8, ED
BUN: 8 mg/dL (ref 6–20)
CREATININE: 0.8 mg/dL (ref 0.44–1.00)
Calcium, Ion: 1.08 mmol/L — ABNORMAL LOW (ref 1.12–1.23)
Chloride: 102 mmol/L (ref 101–111)
GLUCOSE: 94 mg/dL (ref 65–99)
HCT: 38 % (ref 36.0–46.0)
Hemoglobin: 12.9 g/dL (ref 12.0–15.0)
POTASSIUM: 3.3 mmol/L — AB (ref 3.5–5.1)
Sodium: 139 mmol/L (ref 135–145)
TCO2: 23 mmol/L (ref 0–100)

## 2015-11-26 MED ORDER — SODIUM CHLORIDE 0.9 % IV BOLUS (SEPSIS)
1000.0000 mL | Freq: Once | INTRAVENOUS | Status: AC
  Administered 2015-11-27: 1000 mL via INTRAVENOUS

## 2015-11-26 NOTE — ED Notes (Signed)
Pt is coming from home by ems after mother called stating she was having seizures. Mother reports pt had 8 seizures prior to calling ems and 20 in the last 48 hours. Pt has hx of seizures but has not been taking Depakote. Pt received 5mg  Versed and 5mg  Haldol pta by ems. Pt is sedated on arrival- GCS 3

## 2015-11-26 NOTE — ED Provider Notes (Signed)
CSN: [id number]     Arrival date & time 11/26/15  2330 History  By signing my name below, I, Amanda Valentine, attest that this documentation has been prepared under the direction and in the presence of [name], MD. Electronically Signed: Bethel Valentine, ED Scribe. 11/27/2015. 1:36 AM   Chief Complaint  Patient presents with  . Seizures   Level V caveat due to unresponsiveness   The history is provided by the EMS personnel. No language interpreter was used.   Brought in by EMS from home, Amanda Valentine is a 40 y.o. female with PMHx of epilepsy who presents to the Emergency Department for evaluation of seizures with onset 48 hours ago. She reportedly had more than 20 seizures over the last 48 hours with 8 of them being today.  Per report, the patient has been out her depakote. Pt received 5mg  Versed and 5mg  Haldol PTA by EMS.   Past Medical History  Diagnosis Date  . Epilepsy (HCC)   . Bipolar 1 disorder (HCC)   . Anxiety   . Depression   . Chronic bronchitis (HCC)   . Diabetes mellitus without complication (HCC)   . Hypertension    Past Surgical History  Procedure Laterality Date  . Tubal ligation     No family history on file. Social History  Substance Use Topics  . Smoking status: Current Every Day Smoker -- 0.50 packs/day    Types: Cigarettes  . Smokeless tobacco: None  . Alcohol Use: Yes     Comment: occasionally   OB History    No data available     Review of Systems  Unable to perform ROS: Patient unresponsive   Allergies  Penicillins and Orange juice  Home Medications   Prior to Admission medications   Medication Sig Start Date End Date Taking? Authorizing Provider  albuterol (PROVENTIL) (2.5 MG/3ML) 0.083% nebulizer solution Take 2.5 mg by nebulization every 6 (six) hours as needed for wheezing.    Historical Provider, MD  ALPRAZolam Prudy Feeler) 1 MG tablet Take 1 mg by mouth 3 (three) times daily as needed. For anxiety    Historical Provider, MD   cyclobenzaprine (FLEXERIL) 10 MG tablet Take 1 tablet (10 mg total) by mouth 2 (two) times daily as needed for muscle spasms. 01/26/15   Elpidio Anis, PA-C  divalproex (DEPAKOTE ER) 500 MG 24 hr tablet Take 500 mg by mouth at bedtime.    Historical Provider, MD  ibuprofen (ADVIL,MOTRIN) 800 MG tablet Take 1 tablet (800 mg total) by mouth 3 (three) times daily. 01/26/15   Elpidio Anis, PA-C  QUEtiapine Fumarate (SEROQUEL XR) 150 MG 24 hr tablet Take 75 mg by mouth at bedtime.    Historical Provider, MD  tiotropium (SPIRIVA) 18 MCG inhalation capsule Place 18 mcg into inhaler and inhale daily.    Historical Provider, MD   BP 88/58 mmHg  Pulse 70  Temp(Src) 98.6 F (37 C) (Oral)  Resp 12  SpO2 93%  LMP 08/07/2015 (Approximate) Physical Exam  Constitutional: She appears well-developed and well-nourished. No distress.  Not reactive to physical stimuli  Nasal canula in place   HENT:  Head: Normocephalic and atraumatic.  Nose: Nose normal.  Mouth/Throat: Oropharynx is clear and moist. No oropharyngeal exudate.  Eyes: Conjunctivae are normal. Pupils are equal, round, and reactive to light. No scleral icterus.  Pupils pinpoint and reactive   Neck: No JVD present. No tracheal deviation present. No thyromegaly present.  Cardiovascular: Normal rate, regular rhythm and normal heart sounds.  Exam reveals no gallop and no friction rub.   No murmur heard. Pulmonary/Chest: Effort normal and breath sounds normal. No respiratory distress. She has no wheezes. She exhibits no tenderness.  Abdominal: Soft. Bowel sounds are normal. She exhibits no distension and no mass. There is no tenderness. There is no rebound and no guarding.  Musculoskeletal: She exhibits no edema or tenderness.  Lymphadenopathy:    She has no cervical adenopathy.  Neurological:  GCS 3 Appears post-ictal   Skin: Skin is warm and dry. No rash noted. No erythema. No pallor.  Nursing note and vitals reviewed.   ED Course   Procedures (including critical care time) DIAGNOSTIC STUDIES: Oxygen Saturation is 100% on 2L, adequate by my interpretation.    COORDINATION OF CARE: 11:46 PM Treatment plan includes lab work, CT head without contrast, CXR, and IVF .  1:25 AM-Consult complete with Dr. Toniann FailKakrakandy (Hospitalist). Patient case explained and discussed. Agrees to admit patient for further evaluation and treatment. Call ended at 1:26 AM    Labs Review Labs Reviewed  CBC WITH DIFFERENTIAL/PLATELET - Abnormal; Notable for the following:    WBC 3.9 (*)    RBC 3.66 (*)    Hemoglobin 11.7 (*)    HCT 33.5 (*)    Neutro Abs 1.2 (*)    All other components within normal limits  COMPREHENSIVE METABOLIC PANEL - Abnormal; Notable for the following:    Potassium 3.0 (*)    Calcium 8.8 (*)    Total Protein 6.0 (*)    AST 44 (*)    All other components within normal limits  URINALYSIS, ROUTINE W REFLEX MICROSCOPIC (NOT AT [facility]) - Abnormal; Notable for the following:    Color, Urine RED (*)    APPearance CLOUDY (*)    Bilirubin Urine SMALL (*)    Ketones, ur 15 (*)    Nitrite POSITIVE (*)    Leukocytes, UA SMALL (*)    All other components within normal limits  ETHANOL - Abnormal; Notable for the following:    Alcohol, Ethyl (B) 93 (*)    All other components within normal limits  VALPROIC ACID LEVEL - Abnormal; Notable for the following:    Valproic Acid Lvl 35 (*)    All other components within normal limits  ACETAMINOPHEN LEVEL - Abnormal; Notable for the following:    Acetaminophen (Tylenol), Serum <10 (*)    All other components within normal limits  URINE MICROSCOPIC-ADD ON - Abnormal; Notable for the following:    Squamous Epithelial / LPF 0-5 (*)    Bacteria, UA RARE (*)    Casts HYALINE CASTS (*)    All other components within normal limits  I-STAT CG4 LACTIC ACID, ED - Abnormal; Notable for the following:    Lactic Acid, Venous 2.45 (*)    All other components within normal limits  I-STAT  CHEM 8, ED - Abnormal; Notable for the following:    Potassium 3.3 (*)    Calcium, Ion 1.08 (*)    All other components within normal limits  POC OCCULT BLOOD, ED - Abnormal; Notable for the following:    Fecal Occult Bld POSITIVE (*)    All other components within normal limits  LIPASE, BLOOD  MAGNESIUM  SALICYLATE LEVEL  URINE RAPID DRUG SCREEN, HOSP PERFORMED  PROTIME-INR  APTT  POC URINE PREG, ED  CBG MONITORING, ED  TYPE AND SCREEN    Imaging Review Ct Head Wo Contrast  11/27/2015  CLINICAL DATA:  Altered mental status, seizure,  postictal, history diabetes mellitus, hypertension, epilepsy EXAM: CT HEAD WITHOUT CONTRAST TECHNIQUE: Contiguous axial images were obtained from the base of the skull through the vertex without intravenous contrast. COMPARISON:  01/26/2015 FINDINGS: Normal ventricular morphology. No midline shift or mass effect. Otherwise normal appearance of brain parenchyma. No intracranial hemorrhage, mass lesion, evidence acute infarction or extra-axial fluid collection. Bones and sinuses unremarkable. IMPRESSION: No acute intracranial abnormalities. No significant interval change. Electronically Signed   By: Ulyses Southward M.D.   On: 11/27/2015 01:01   Dg Chest Port 1 View  11/27/2015  CLINICAL DATA:  Seizure, altered mental status, hypertension, diabetes mellitus, smoker EXAM: PORTABLE CHEST 1 VIEW COMPARISON:  Portable exam 2032 hours compared to 04/30/2010 FINDINGS: Normal heart size, mediastinal contours and pulmonary vascularity. Decreased lung volumes versus previous study with bibasilar opacities which could represent atelectasis or infiltrate. Upper lungs clear. No pleural effusion or pneumothorax. IMPRESSION: Question bibasilar atelectasis versus infiltrate. Electronically Signed   By: Ulyses Southward M.D.   On: 11/27/2015 00:43   I have personally reviewed and evaluated these images and lab results as part of my medical decision-making.   EKG  Interpretation   Date/Time:  Monday Nov 26 2015 23:59:03 EDT Ventricular Rate:  69 PR Interval:  179 QRS Duration: 98 QT Interval:  429 QTC Calculation: 460 R Axis:   41 Text Interpretation:  Sinus rhythm Probable anteroseptal infarct, old  Baseline wander in lead(s) V6 No significant change since last tracing  Confirmed by Erroll Luna [phone]) on 11/27/2015 12:02:15 AM      MDM   Final diagnoses:  None     Patient presents to the ED for seizures.  She was given versed and haldol and is quite lethargic now, although she will talk to you.  Labs show a hgb drop of 3units in 1 month.  Hemocult is positive for unknown reasons.  Type and screen sent.  She was given 1L IVF and an additional 2L for persistent hypotension.  CT head and cxr negative.  Potassium replaced as well  I spoke with Dr. Toniann Fail who accepts the patient for further care.  I personally performed the services described in this documentation, which was scribed in my presence. The recorded information has been reviewed and is accurate.     [name], MD 11/27/15 [id number]

## 2015-11-27 ENCOUNTER — Encounter (HOSPITAL_COMMUNITY): Payer: Self-pay | Admitting: Internal Medicine

## 2015-11-27 ENCOUNTER — Observation Stay (HOSPITAL_COMMUNITY): Payer: Medicaid Other

## 2015-11-27 ENCOUNTER — Emergency Department (HOSPITAL_COMMUNITY): Payer: Medicaid Other

## 2015-11-27 DIAGNOSIS — F419 Anxiety disorder, unspecified: Secondary | ICD-10-CM | POA: Diagnosis present

## 2015-11-27 DIAGNOSIS — K922 Gastrointestinal hemorrhage, unspecified: Secondary | ICD-10-CM | POA: Diagnosis not present

## 2015-11-27 DIAGNOSIS — F319 Bipolar disorder, unspecified: Secondary | ICD-10-CM | POA: Diagnosis present

## 2015-11-27 DIAGNOSIS — G8929 Other chronic pain: Secondary | ICD-10-CM | POA: Diagnosis present

## 2015-11-27 DIAGNOSIS — R569 Unspecified convulsions: Secondary | ICD-10-CM | POA: Diagnosis present

## 2015-11-27 DIAGNOSIS — E119 Type 2 diabetes mellitus without complications: Secondary | ICD-10-CM

## 2015-11-27 DIAGNOSIS — G40909 Epilepsy, unspecified, not intractable, without status epilepticus: Secondary | ICD-10-CM | POA: Diagnosis present

## 2015-11-27 DIAGNOSIS — F17210 Nicotine dependence, cigarettes, uncomplicated: Secondary | ICD-10-CM | POA: Diagnosis present

## 2015-11-27 DIAGNOSIS — J449 Chronic obstructive pulmonary disease, unspecified: Secondary | ICD-10-CM | POA: Diagnosis present

## 2015-11-27 DIAGNOSIS — F1210 Cannabis abuse, uncomplicated: Secondary | ICD-10-CM | POA: Diagnosis present

## 2015-11-27 DIAGNOSIS — Z79899 Other long term (current) drug therapy: Secondary | ICD-10-CM | POA: Diagnosis not present

## 2015-11-27 DIAGNOSIS — F1010 Alcohol abuse, uncomplicated: Secondary | ICD-10-CM | POA: Diagnosis present

## 2015-11-27 DIAGNOSIS — I10 Essential (primary) hypertension: Secondary | ICD-10-CM | POA: Diagnosis present

## 2015-11-27 DIAGNOSIS — Z880 Allergy status to penicillin: Secondary | ICD-10-CM | POA: Diagnosis not present

## 2015-11-27 DIAGNOSIS — Z794 Long term (current) use of insulin: Secondary | ICD-10-CM | POA: Diagnosis not present

## 2015-11-27 LAB — COMPREHENSIVE METABOLIC PANEL
ALBUMIN: 2.9 g/dL — AB (ref 3.5–5.0)
ALBUMIN: 3.5 g/dL (ref 3.5–5.0)
ALK PHOS: 45 U/L (ref 38–126)
ALT: 52 U/L (ref 14–54)
ALT: 52 U/L (ref 14–54)
ANION GAP: 13 (ref 5–15)
AST: 44 U/L — AB (ref 15–41)
AST: 54 U/L — AB (ref 15–41)
Alkaline Phosphatase: 52 U/L (ref 38–126)
Anion gap: 8 (ref 5–15)
BILIRUBIN TOTAL: 0.5 mg/dL (ref 0.3–1.2)
BUN: 7 mg/dL (ref 6–20)
BUN: 7 mg/dL (ref 6–20)
CALCIUM: 7.7 mg/dL — AB (ref 8.9–10.3)
CO2: 17 mmol/L — ABNORMAL LOW (ref 22–32)
CO2: 22 mmol/L (ref 22–32)
Calcium: 8.8 mg/dL — ABNORMAL LOW (ref 8.9–10.3)
Chloride: 106 mmol/L (ref 101–111)
Chloride: 110 mmol/L (ref 101–111)
Creatinine, Ser: 0.69 mg/dL (ref 0.44–1.00)
Creatinine, Ser: 0.7 mg/dL (ref 0.44–1.00)
GFR calc Af Amer: >60 mL/min (ref >60)
GFR calc Af Amer: >60 mL/min (ref >60)
GFR calc non Af Amer: >60 mL/min (ref >60)
GLUCOSE: 82 mg/dL (ref 65–99)
GLUCOSE: 99 mg/dL (ref 65–99)
Potassium: 3 mmol/L — ABNORMAL LOW (ref 3.5–5.1)
Potassium: 3.6 mmol/L (ref 3.5–5.1)
Sodium: 136 mmol/L (ref 135–145)
Sodium: 140 mmol/L (ref 135–145)
TOTAL PROTEIN: 4.7 g/dL — AB (ref 6.5–8.1)
Total Bilirubin: 0.4 mg/dL (ref 0.3–1.2)
Total Protein: 6 g/dL — ABNORMAL LOW (ref 6.5–8.1)

## 2015-11-27 LAB — URINALYSIS, ROUTINE W REFLEX MICROSCOPIC
Glucose, UA: NEGATIVE mg/dL
Hgb urine dipstick: NEGATIVE
KETONES UR: 15 mg/dL — AB
NITRITE: POSITIVE — AB
Protein, ur: NEGATIVE mg/dL
SPECIFIC GRAVITY, URINE: 1.015 (ref 1.005–1.030)
pH: 5 (ref 5.0–8.0)

## 2015-11-27 LAB — ABO/RH: ABO/RH(D): O NEG

## 2015-11-27 LAB — CBC
HCT: 33.2 % — ABNORMAL LOW (ref 36.0–46.0)
HEMATOCRIT: 36.7 % (ref 36.0–46.0)
Hemoglobin: 12.3 g/dL (ref 12.0–15.0)
Hemoglobin: 11.1 g/dL — ABNORMAL LOW (ref 12.0–15.0)
MCH: 31.4 pg (ref 26.0–34.0)
MCH: 31 pg (ref 26.0–34.0)
MCHC: 33.5 g/dL (ref 30.0–36.0)
MCHC: 33.4 g/dL (ref 30.0–36.0)
MCV: 93.6 fL (ref 78.0–100.0)
MCV: 92.7 fL (ref 78.0–100.0)
PLATELETS: 137 10*3/uL — AB (ref 150–400)
PLATELETS: 174 10*3/uL (ref 150–400)
RBC: 3.92 MIL/uL (ref 3.87–5.11)
RBC: 3.58 MIL/uL — ABNORMAL LOW (ref 3.87–5.11)
RDW: 12.9 % (ref 11.5–15.5)
RDW: 12.9 % (ref 11.5–15.5)
WBC: 3.7 10*3/uL — ABNORMAL LOW (ref 4.0–10.5)
WBC: 3.5 10*3/uL — ABNORMAL LOW (ref 4.0–10.5)

## 2015-11-27 LAB — GLUCOSE, CAPILLARY
GLUCOSE-CAPILLARY: 84 mg/dL (ref 65–99)
GLUCOSE-CAPILLARY: 87 mg/dL (ref 65–99)
Glucose-Capillary: 86 mg/dL (ref 65–99)

## 2015-11-27 LAB — PROTIME-INR
INR: 1.29 (ref 0.00–1.49)
Prothrombin Time: 16.3 seconds — ABNORMAL HIGH (ref 11.6–15.2)

## 2015-11-27 LAB — MRSA PCR SCREENING: MRSA by PCR: NEGATIVE

## 2015-11-27 LAB — ACETAMINOPHEN LEVEL: Acetaminophen (Tylenol), Serum: <10 ug/mL — ABNORMAL LOW (ref 10–30)

## 2015-11-27 LAB — URINE MICROSCOPIC-ADD ON

## 2015-11-27 LAB — LACTIC ACID, PLASMA
Lactic Acid, Venous: 2.2 mmol/L (ref 0.5–2.0)
Lactic Acid, Venous: 2.1 mmol/L (ref 0.5–2.0)

## 2015-11-27 LAB — TYPE AND SCREEN
ABO/RH(D): O NEG
Antibody Screen: NEGATIVE

## 2015-11-27 LAB — RAPID URINE DRUG SCREEN, HOSP PERFORMED
Amphetamines: NOT DETECTED
BENZODIAZEPINES: POSITIVE — AB
Barbiturates: NOT DETECTED
Cocaine: NOT DETECTED
OPIATES: NOT DETECTED
Tetrahydrocannabinol: POSITIVE — AB

## 2015-11-27 LAB — POC URINE PREG, ED: Preg Test, Ur: NEGATIVE

## 2015-11-27 LAB — AMMONIA: AMMONIA: 98 umol/L — AB (ref 9–35)

## 2015-11-27 LAB — VALPROIC ACID LEVEL: Valproic Acid Lvl: 35 ug/mL — ABNORMAL LOW (ref 50.0–100.0)

## 2015-11-27 LAB — CBG MONITORING, ED: Glucose-Capillary: 119 mg/dL — ABNORMAL HIGH (ref 65–99)

## 2015-11-27 LAB — APTT: aPTT: 25 s (ref 24–37)

## 2015-11-27 LAB — SALICYLATE LEVEL: Salicylate Lvl: <4 mg/dL (ref 2.8–30.0)

## 2015-11-27 LAB — I-STAT CG4 LACTIC ACID, ED: LACTIC ACID, VENOUS: 2.45 mmol/L — AB (ref 0.5–2.0)

## 2015-11-27 LAB — LIPASE, BLOOD: Lipase: 26 U/L (ref 11–51)

## 2015-11-27 LAB — ETHANOL: ALCOHOL ETHYL (B): 93 mg/dL — AB (ref <5)

## 2015-11-27 LAB — POC OCCULT BLOOD, ED: Fecal Occult Bld: POSITIVE — AB

## 2015-11-27 LAB — MAGNESIUM: Magnesium: 2.1 mg/dL (ref 1.7–2.4)

## 2015-11-27 LAB — PROCALCITONIN

## 2015-11-27 MED ORDER — VALPROATE SODIUM 500 MG/5ML IV SOLN: 500.0000 mg | Freq: Every day | INTRAVENOUS | Status: DC

## 2015-11-27 MED ORDER — LORAZEPAM 2 MG/ML IJ SOLN: 1.0000 mg | Freq: Four times a day (QID) | INTRAMUSCULAR | Status: DC | PRN

## 2015-11-27 MED ORDER — TIOTROPIUM BROMIDE MONOHYDRATE 18 MCG IN CAPS
18.0000 ug | ORAL_CAPSULE | Freq: Every day | RESPIRATORY_TRACT | Status: DC
  Filled 2015-11-27: qty 5

## 2015-11-27 MED ORDER — PANTOPRAZOLE SODIUM 40 MG IV SOLR
40.0000 mg | Freq: Two times a day (BID) | INTRAVENOUS | Status: DC
  Administered 2015-11-27 (×2): 40 mg via INTRAVENOUS
  Filled 2015-11-27 (×2): qty 40

## 2015-11-27 MED ORDER — VALPROATE SODIUM 500 MG/5ML IV SOLN
500.0000 mg | Freq: Once | INTRAVENOUS | Status: AC
  Administered 2015-11-27: 500 mg via INTRAVENOUS
  Filled 2015-11-27: qty 5

## 2015-11-27 MED ORDER — METRONIDAZOLE IN NACL 5-0.79 MG/ML-% IV SOLN
500.0000 mg | Freq: Three times a day (TID) | INTRAVENOUS | Status: DC
  Administered 2015-11-27 (×2): 500 mg via INTRAVENOUS
  Filled 2015-11-27 (×2): qty 100

## 2015-11-27 MED ORDER — LACTULOSE ENEMA
300.0000 mL | Freq: Once | ORAL | Status: DC
  Filled 2015-11-27: qty 300

## 2015-11-27 MED ORDER — LEVOFLOXACIN IN D5W 750 MG/150ML IV SOLN
750.0000 mg | Freq: Every day | INTRAVENOUS | Status: DC
  Administered 2015-11-27: 750 mg via INTRAVENOUS
  Filled 2015-11-27: qty 150

## 2015-11-27 MED ORDER — SODIUM CHLORIDE 0.9 % IV SOLN
Freq: Once | INTRAVENOUS | Status: AC
  Administered 2015-11-27: 05:00:00 via INTRAVENOUS

## 2015-11-27 MED ORDER — THIAMINE HCL 100 MG/ML IJ SOLN
100.0000 mg | INTRAMUSCULAR | Status: DC
  Administered 2015-11-27: 100 mg via INTRAVENOUS
  Filled 2015-11-27: qty 2

## 2015-11-27 MED ORDER — ALPRAZOLAM 0.25 MG PO TABS: 1.0000 mg | ORAL_TABLET | Freq: Three times a day (TID) | ORAL | Status: DC | PRN

## 2015-11-27 MED ORDER — ALBUTEROL SULFATE (2.5 MG/3ML) 0.083% IN NEBU
2.5000 mg | INHALATION_SOLUTION | Freq: Four times a day (QID) | RESPIRATORY_TRACT | Status: DC | PRN
Start: 2015-11-27 — End: 2015-11-27

## 2015-11-27 MED ORDER — CIPROFLOXACIN HCL 500 MG PO TABS: 500.0000 mg | ORAL_TABLET | Freq: Two times a day (BID) | ORAL | Status: DC

## 2015-11-27 MED ORDER — LEVETIRACETAM 500 MG PO TABS: 500.0000 mg | ORAL_TABLET | Freq: Two times a day (BID) | ORAL | Status: DC

## 2015-11-27 MED ORDER — LORAZEPAM 2 MG/ML IJ SOLN: 1.0000 mg | INTRAMUSCULAR | Status: DC | PRN

## 2015-11-27 MED ORDER — SODIUM CHLORIDE 0.9 % IV BOLUS (SEPSIS)
2000.0000 mL | Freq: Once | INTRAVENOUS | Status: AC
  Administered 2015-11-27: 2000 mL via INTRAVENOUS

## 2015-11-27 MED ORDER — LACTULOSE 10 GM/15ML PO SOLN: 20.0000 g | Freq: Two times a day (BID) | ORAL | Status: DC

## 2015-11-27 MED ORDER — QUETIAPINE FUMARATE ER 50 MG PO TB24: 100.0000 mg | ORAL_TABLET | Freq: Every day | ORAL | Status: DC

## 2015-11-27 MED ORDER — SODIUM CHLORIDE 0.9 % IV SOLN
500.0000 mg | Freq: Two times a day (BID) | INTRAVENOUS | Status: DC
  Filled 2015-11-27 (×2): qty 5

## 2015-11-27 MED ORDER — QUETIAPINE FUMARATE ER 150 MG PO TB24: 75.0000 mg | ORAL_TABLET | Freq: Every day | ORAL | Status: DC

## 2015-11-27 MED ORDER — VALPROATE SODIUM 500 MG/5ML IV SOLN: 500.0000 mg | Freq: Two times a day (BID) | INTRAVENOUS | Status: DC

## 2015-11-27 MED ORDER — SODIUM CHLORIDE 0.9 % IV SOLN
1000.0000 mg | Freq: Once | INTRAVENOUS | Status: AC
  Administered 2015-11-27: 1000 mg via INTRAVENOUS
  Filled 2015-11-27: qty 10

## 2015-11-27 MED ORDER — SODIUM CHLORIDE 0.9 % IV SOLN
INTRAVENOUS | Status: DC
  Administered 2015-11-27: 04:00:00 via INTRAVENOUS

## 2015-11-27 MED ORDER — POTASSIUM CHLORIDE 10 MEQ/100ML IV SOLN
10.0000 meq | INTRAVENOUS | Status: AC
  Administered 2015-11-27 (×2): 10 meq via INTRAVENOUS
  Filled 2015-11-27 (×2): qty 100

## 2015-11-27 MED ORDER — LACTULOSE 10 GM/15ML PO SOLN
20.0000 g | Freq: Two times a day (BID) | ORAL | Status: DC
  Administered 2015-11-27: 20 g via ORAL
  Filled 2015-11-27 (×2): qty 30

## 2015-11-27 MED ORDER — QUETIAPINE FUMARATE ER 50 MG PO TB24
100.0000 mg | ORAL_TABLET | Freq: Every day | ORAL | Status: DC
  Filled 2015-11-27: qty 2

## 2015-11-27 MED ORDER — INSULIN ASPART 100 UNIT/ML ~~LOC~~ SOLN: 0.0000 [IU] | SUBCUTANEOUS | Status: DC

## 2015-11-27 NOTE — ED Notes (Signed)
Admitting MD paged 

## 2015-11-27 NOTE — Progress Notes (Signed)
Pt given discharge packet. Reviewed new medications with patient and explained the importance of taking Keppra to reduce the risk of seizures. Education also given on importance of making follow up appointments with patients psychologist, and neurologist. IV's removed and patient wheeled out of facility.
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
## 2015-11-27 NOTE — Procedures (Signed)
ELECTROENCEPHALOGRAM REPORT  Date of Study: 11/27/2015  Patient's Name: Amanda Valentine MRN: [id number] Date of Birth: 01-04-1976  Referring Provider: Midge MiniumArshad Kakrakandy, MD  Indication: 40 year old female with bipolar disorder and seizures who presents after multiple seizures.  She had been noncompliant with her Depakote and was given Versed, Haldol and Depakote.  Medications: 0.9 % sodium chloride infusion albuterol (PROVENTIL) (2.5 MG/3ML) 0.083% nebulizer solution 2.5 mg ALPRAZolam (XANAX) tablet 1 mg insulin aspart (novoLOG) injection 0-9 Units lactulose (CHRONULAC) 10 GM/15ML solution 20 g levETIRAcetam (KEPPRA) 500 mg in sodium chloride 0.9 % 100 mL IVPB levofloxacin (LEVAQUIN) IVPB 750 mg LORazepam (ATIVAN) injection 1 mg metroNIDAZOLE (FLAGYL) IVPB 500 mg pantoprazole (PROTONIX) injection 40 mg QUEtiapine (SEROQUEL XR) 24 hr tablet 100 mg thiamine (B-1) injection 100 mg tiotropium (SPIRIVA) inhalation capsule 18 mcg   Technical Summary: This is a multichannel digital EEG recording, using the international 10-20 placement system with electrodes applied with paste and impedances below 5000 ohms.    Description: The EEG background is symmetric but slow with a posterior dominant rhythm of 12 to 13 Hz, which is reactive to eye opening and closing.  Diffuse beta activity is seen, with a bilateral frontal preponderance.  No focal or abnormalities are seen.  No focal or generalized epileptiform discharges are seen.  Stage II sleep is not seen.  Hyperventilation and photic stimulation were not performed.  ECG revealed normal cardiac rate and rhythm.  Impression: This is an abnormal EEG due to generalized slowing, indicative of diffuse cerebral dysfunction.  This is a nonspecific finding which is likely pharmacologic effect but does not rule out postictal state.  Veryl Abril R. Everlena CooperJaffe, DO

## 2015-11-27 NOTE — H&P (Addendum)
History and Physical    Amanda KinnierBeth M Valentine WUJ:[id number] DOB: 28-Apr-1976 DOA: 11/26/2015  PCP: Pcp Not In System  Patient coming from: Home.  Chief Complaint: Seizures.  History obtained from patient's mother.  HPI: Amanda KinnierBeth M Valentine is a 40 y.o. female with medical history significant of bipolar disorder, seizures, diabetes mellitus type 2 and tobacco abuse was brought to the ER after patient was found to have multiple seizures. As per patient's mother patient has had multiple seizures on 11/25/2015 but patient refused to come to the ER. Patient also had again multiple seizures yesterday which was tonic-clonic and patient would lose consciousness following the seizures. Patient is on Depakote and as per the patient's mother patient has not been recently taking her medications as advised since patient has had some family issues and had separated from her husband. No definite indications of any suicidal plans or ideation. EMS was called and patient was given Versed and Haldol. Depakote levels are found to be subtherapeutic. Patient was loaded with Depakote 500 mg IV and admitted for seizures. On exam patient is lethargic but later on patient did onset some questions to the nurse. Alcohol levels are positive. Drug screen is positive for marijuana. In addition patient is also found to be hypotensive and chest x-ray showing possible infiltrates and UA showing leukocyte esterase and nitrites positive.  ED Course: IV Depakote 500 mg IV and fluid bolus.  Review of Systems: As per HPI otherwise 10 point review of systems negative.    Past Medical History  Diagnosis Date  . Epilepsy (HCC)   . Bipolar 1 disorder (HCC)   . Anxiety   . Depression   . Chronic bronchitis (HCC)   . Diabetes mellitus without complication (HCC)   . Hypertension     Past Surgical History  Procedure Laterality Date  . Tubal ligation       reports that she has been smoking Cigarettes.  She has been smoking about 0.50 packs  per day. She does not have any smokeless tobacco history on file. She reports that she drinks alcohol. She reports that she does not use illicit drugs.  Allergies  Allergen Reactions  . Penicillins   . Orange Juice [Orange Oil] Rash    Family History  Problem Relation Age of Onset  . Seizures Maternal Grandmother   . Diabetes Mellitus II Maternal Grandfather     Prior to Admission medications   Medication Sig Start Date End Date Taking? Authorizing Provider  albuterol (PROVENTIL) (2.5 MG/3ML) 0.083% nebulizer solution Take 2.5 mg by nebulization every 6 (six) hours as needed for wheezing.    Historical Provider, MD  ALPRAZolam Prudy Feeler(XANAX) 1 MG tablet Take 1 mg by mouth 3 (three) times daily as needed. For anxiety    Historical Provider, MD  cyclobenzaprine (FLEXERIL) 10 MG tablet Take 1 tablet (10 mg total) by mouth 2 (two) times daily as needed for muscle spasms. 01/26/15   Elpidio AnisShari Upstill, PA-C  divalproex (DEPAKOTE ER) 500 MG 24 hr tablet Take 500 mg by mouth at bedtime.    Historical Provider, MD  ibuprofen (ADVIL,MOTRIN) 800 MG tablet Take 1 tablet (800 mg total) by mouth 3 (three) times daily. 01/26/15   Elpidio AnisShari Upstill, PA-C  QUEtiapine Fumarate (SEROQUEL XR) 150 MG 24 hr tablet Take 75 mg by mouth at bedtime.    Historical Provider, MD  tiotropium (SPIRIVA) 18 MCG inhalation capsule Place 18 mcg into inhaler and inhale daily.    Historical Provider, MD    Physical  Exam: Filed Vitals:   11/27/15 0130 11/27/15 0145 11/27/15 0233 11/27/15 0240  BP: 87/52 85/55 84/52  87/57  Pulse: 62 62 62 57  Temp:      TempSrc:      Resp: 10 10 10 10   SpO2: 100% 100% 100% 100%      Constitutional: Lethargic. Filed Vitals:   11/27/15 0130 11/27/15 0145 11/27/15 0233 11/27/15 0240  BP: 87/52 85/55 84/52  87/57  Pulse: 62 62 62 57  Temp:      TempSrc:      Resp: 10 10 10 10   SpO2: 100% 100% 100% 100%   Eyes: Anicteric no pallor. ENMT: No discharge from the ears eyes nose or mouth. Neck: No  neck rigidity no mass felt. Respiratory: No rhonchi or crepitations. Cardiovascular: S1 and S2 heard. Abdomen: Soft nontender bowel sounds present. Musculoskeletal: No edema. Skin: No rash. Neurologic: Lethargic pupils are reacting. Psychiatric: Lethargic.   Labs on Admission: I have personally reviewed following labs and imaging studies  CBC:  Recent Labs Lab 11/26/15 2345 11/27/15  WBC 3.9*  --   NEUTROABS 1.2*  --   HGB 11.7* 12.9  HCT 33.5* 38.0  MCV 91.5  --   PLT 196  --    Basic Metabolic Panel:  Recent Labs Lab 11/26/15 2345 11/27/15  NA 136 139  K 3.0* 3.3*  CL 106 102  CO2 22  --   GLUCOSE 99 94  BUN 7 8  CREATININE 0.70 0.80  CALCIUM 8.8*  --   MG 2.1  --    GFR: CrCl cannot be calculated (Unknown ideal weight.). Liver Function Tests:  Recent Labs Lab 11/26/15 2345  AST 44*  ALT 52  ALKPHOS 52  BILITOT 0.4  PROT 6.0*  ALBUMIN 3.5    Recent Labs Lab 11/26/15 2345  LIPASE 26   No results for input(s): AMMONIA in the last 168 hours. Coagulation Profile: No results for input(s): INR, PROTIME in the last 168 hours. Cardiac Enzymes: No results for input(s): CKTOTAL, CKMB, CKMBINDEX, TROPONINI in the last 168 hours. BNP (last 3 results) No results for input(s): PROBNP in the last 8760 hours. HbA1C: No results for input(s): HGBA1C in the last 72 hours. CBG: No results for input(s): GLUCAP in the last 168 hours. Lipid Profile: No results for input(s): CHOL, HDL, LDLCALC, TRIG, CHOLHDL, LDLDIRECT in the last 72 hours. Thyroid Function Tests: No results for input(s): TSH, T4TOTAL, FREET4, T3FREE, THYROIDAB in the last 72 hours. Anemia Panel: No results for input(s): VITAMINB12, FOLATE, FERRITIN, TIBC, IRON, RETICCTPCT in the last 72 hours. Urine analysis:    Component Value Date/Time   COLORURINE RED* 11/27/2015 0042   COLORURINE Amber 07/22/2013 1356   APPEARANCEUR CLOUDY* 11/27/2015 0042   APPEARANCEUR Cloudy 07/22/2013 1356    LABSPEC 1.015 11/27/2015 0042   LABSPEC 1.025 07/22/2013 1356   PHURINE 5.0 11/27/2015 0042   PHURINE 6.0 07/22/2013 1356   GLUCOSEU NEGATIVE 11/27/2015 0042   GLUCOSEU Negative 07/22/2013 1356   HGBUR NEGATIVE 11/27/2015 0042   HGBUR Negative 07/22/2013 1356   BILIRUBINUR SMALL* 11/27/2015 0042   BILIRUBINUR Negative 07/22/2013 1356   KETONESUR 15* 11/27/2015 0042   KETONESUR Trace 07/22/2013 1356   PROTEINUR NEGATIVE 11/27/2015 0042   PROTEINUR Negative 07/22/2013 1356   UROBILINOGEN 1.0 02/14/2014 1610   NITRITE POSITIVE* 11/27/2015 0042   NITRITE Negative 07/22/2013 1356   LEUKOCYTESUR SMALL* 11/27/2015 0042   LEUKOCYTESUR Negative 07/22/2013 1356   Sepsis Labs: @LABRCNTIP (procalcitonin:4,lacticidven:4) )No results found for this or any  previous visit (from the past 240 hour(s)).   Radiological Exams on Admission: Ct Head Wo Contrast  11/27/2015  CLINICAL DATA:  Altered mental status, seizure, postictal, history diabetes mellitus, hypertension, epilepsy EXAM: CT HEAD WITHOUT CONTRAST TECHNIQUE: Contiguous axial images were obtained from the base of the skull through the vertex without intravenous contrast. COMPARISON:  01/26/2015 FINDINGS: Normal ventricular morphology. No midline shift or mass effect. Otherwise normal appearance of brain parenchyma. No intracranial hemorrhage, mass lesion, evidence acute infarction or extra-axial fluid collection. Bones and sinuses unremarkable. IMPRESSION: No acute intracranial abnormalities. No significant interval change. Electronically Signed   By: Ulyses Southward M.D.   On: 11/27/2015 01:01   Dg Chest Port 1 View  11/27/2015  CLINICAL DATA:  Seizure, altered mental status, hypertension, diabetes mellitus, smoker EXAM: PORTABLE CHEST 1 VIEW COMPARISON:  Portable exam 2032 hours compared to 04/30/2010 FINDINGS: Normal heart size, mediastinal contours and pulmonary vascularity. Decreased lung volumes versus previous study with bibasilar opacities  which could represent atelectasis or infiltrate. Upper lungs clear. No pleural effusion or pneumothorax. IMPRESSION: Question bibasilar atelectasis versus infiltrate. Electronically Signed   By: Ulyses Southward M.D.   On: 11/27/2015 00:43    EKG: Independently reviewed. Normal sinus rhythm.  Assessment/Plan Principal Problem:   Seizure (HCC) Active Problems:   Bipolar disorder (HCC)   Diabetes mellitus type 2 in nonobese (HCC)   GI bleed   Seizures (HCC)   Acute GI bleeding    #1. Seizures - as per patient's mother patient has been having seizures for last 8 years after head trauma. CT head does not show any acute. Patient has not had any further seizures in the ER. Depakote levels are subtherapeutic and patient was given Depakote 500 mg IV bolus. I did discuss with Dr. Carloyn Jaeger on-call neurologist to advise to keep Depakote at 500 mg every 12 hourly. Since patient is still lethargic I have ordered ammonia levels. #2. Hypotension - patient does not have any definite signs of sepsis at this time. However patient's lactic as is elevated probably from seizures. For now we will keep patient on empiric antibiotics for possible aspiration for which I have added Levaquin and Flagyl which should also cover for possible UTI again get blood cultures urine cultures and continue to monitor lactic acid levels and procalcitonin levels. Continue with aggressive hydration. #3. Possible GI bleed - since patient's hemoglobin was less than the baseline ER physician had done a stool occult blood which was positive. Will closely follow CBC for now we will keep patient on Protonix. #4. Diabetes mellitus type 2 - as per patient's mother patient takes insulin as required if blood sugars are elevated. At this time patient's blood sugars are in the normal range. Closely follow CBGs with sliding scale coverage and check hemoglobin A1c. #5. History of bipolar disorder - patient is on Seroquel. #6. COPD - continue  inhalers. #7. Polysubstance abuse - patient is positive for alcohol, marijuana and also smokes cigarettes. Patient will need counseling once patient is more alert and awake. Will keep patient on CIWA protocol until we get further history once patient is more alert and awake.  Once patient is more alert and awake will need to further take history and physical in detail. Particularly to see if patient had any suicidal ideations.   Addendum - since patient was lethargic I did do a ammonia level which does come elevated at around 98. I have discussed with on-call neurologist Dr. Earleen Reaper what this time advised to hold of  Depakote since patient ammonia levels are elevated. And to place patient on Keppra for seizures. No further episode of seizure but patient is still lethargic but answers questions. Check EEG. I have ordered lactulose per rectally. Recheck ammonia levels later after lactulose given.   DVT prophylaxis: SCDs. Code Status: Full code.  Family Communication: Patient's mother.  Disposition Plan: Home.  Consults called: I have discussed with on-call neurologist Dr. Earleen Reaper.  Admission status: Inpatient. Stepdown. Likely stay 2-3 days.    Eduard Clos MD [facility] Pager [phone].  If 7PM-7AM, please contact night-coverage [email or web address] Password [email or web address]  11/27/2015, 3:08 AM
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
## 2015-11-27 NOTE — ED Notes (Signed)
Phlebotomy at bedside attempting to obtain cultures 
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
## 2015-11-27 NOTE — Progress Notes (Signed)
Pt agitated and yelling at mother in room. Upon arrival in room, patient taking herself off cardiac monitor and pulling IV's out stating, "she is going to leave, she can't take being here anymore". While trying to calm patient down, she insisted seeing the doctor because she doesn't understand why she is here. MD notified, and on the way to bedside. Meantime, multiple attempts were made to calm patient down. 1815: MD arrived at bedside. Education given. Patient insisting on leaving.

## 2015-11-27 NOTE — Progress Notes (Signed)
EEG Completed; Results Pending  

## 2015-11-28 LAB — HEMOGLOBIN A1C
HEMOGLOBIN A1C: 5.1 % (ref 4.8–5.6)
MEAN PLASMA GLUCOSE: 100 mg/dL

## 2015-11-28 LAB — URINE CULTURE: Culture: NO GROWTH

## 2015-12-02 LAB — CULTURE, BLOOD (ROUTINE X 2)
CULTURE: NO GROWTH
Culture: NO GROWTH

## 2016-02-11 ENCOUNTER — Emergency Department (HOSPITAL_COMMUNITY): Payer: Medicaid Other

## 2016-02-11 ENCOUNTER — Encounter (HOSPITAL_COMMUNITY): Payer: Self-pay | Admitting: Emergency Medicine

## 2016-02-11 ENCOUNTER — Emergency Department (HOSPITAL_COMMUNITY)
Admission: EM | Admit: 2016-02-11 | Discharge: 2016-02-11 | Disposition: A | Discharge status: Home / Self Care | Payer: Medicaid Other | Attending: Emergency Medicine | Admitting: Emergency Medicine

## 2016-02-11 DIAGNOSIS — Y939 Activity, unspecified: Secondary | ICD-10-CM | POA: Insufficient documentation

## 2016-02-11 DIAGNOSIS — E119 Type 2 diabetes mellitus without complications: Secondary | ICD-10-CM | POA: Diagnosis not present

## 2016-02-11 DIAGNOSIS — Z7289 Other problems related to lifestyle: Secondary | ICD-10-CM | POA: Insufficient documentation

## 2016-02-11 DIAGNOSIS — S60221A Contusion of right hand, initial encounter: Secondary | ICD-10-CM | POA: Diagnosis not present

## 2016-02-11 DIAGNOSIS — F17210 Nicotine dependence, cigarettes, uncomplicated: Secondary | ICD-10-CM | POA: Diagnosis not present

## 2016-02-11 DIAGNOSIS — I10 Essential (primary) hypertension: Secondary | ICD-10-CM | POA: Diagnosis not present

## 2016-02-11 DIAGNOSIS — S0990XA Unspecified injury of head, initial encounter: Secondary | ICD-10-CM | POA: Diagnosis not present

## 2016-02-11 DIAGNOSIS — Z79899 Other long term (current) drug therapy: Secondary | ICD-10-CM | POA: Diagnosis not present

## 2016-02-11 DIAGNOSIS — Y929 Unspecified place or not applicable: Secondary | ICD-10-CM | POA: Diagnosis not present

## 2016-02-11 DIAGNOSIS — Z23 Encounter for immunization: Secondary | ICD-10-CM | POA: Diagnosis not present

## 2016-02-11 DIAGNOSIS — T07XXXA Unspecified multiple injuries, initial encounter: Secondary | ICD-10-CM

## 2016-02-11 DIAGNOSIS — S6991XA Unspecified injury of right wrist, hand and finger(s), initial encounter: Secondary | ICD-10-CM | POA: Diagnosis present

## 2016-02-11 DIAGNOSIS — S40012A Contusion of left shoulder, initial encounter: Secondary | ICD-10-CM | POA: Diagnosis not present

## 2016-02-11 DIAGNOSIS — S0083XA Contusion of other part of head, initial encounter: Secondary | ICD-10-CM | POA: Insufficient documentation

## 2016-02-11 DIAGNOSIS — Y999 Unspecified external cause status: Secondary | ICD-10-CM | POA: Diagnosis not present

## 2016-02-11 DIAGNOSIS — Z765 Malingerer [conscious simulation]: Secondary | ICD-10-CM

## 2016-02-11 MED ORDER — OXYCODONE-ACETAMINOPHEN 5-325 MG PO TABS
1.0000 | ORAL_TABLET | ORAL | Status: DC | PRN
  Administered 2016-02-11: 1 via ORAL
  Filled 2016-02-11: qty 1

## 2016-02-11 MED ORDER — SODIUM CHLORIDE 0.9 % IV SOLN
1000.0000 mg | Freq: Once | INTRAVENOUS | Status: AC
  Administered 2016-02-11: 1000 mg via INTRAVENOUS
  Filled 2016-02-11: qty 10

## 2016-02-11 MED ORDER — TETANUS-DIPHTH-ACELL PERTUSSIS 5-2.5-18.5 LF-MCG/0.5 IM SUSP
0.5000 mL | Freq: Once | INTRAMUSCULAR | Status: AC
  Administered 2016-02-11: 0.5 mL via INTRAMUSCULAR
  Filled 2016-02-11: qty 0.5

## 2016-02-11 NOTE — ED Notes (Signed)
Called to room pt in bed with eyes rolled back in head and not responding Dr Read Drivers called to room to observe pt within minute pt back to answering question pt states she has not taken her seizure medication for 4 days, seizure precautions in effect.

## 2016-02-11 NOTE — ED Notes (Signed)
No respiratory or acute distress noted alert and oriented x 3 no reaction to medication noted visitor at bedside. 
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
## 2016-02-11 NOTE — ED Notes (Signed)
Pt became belligerent and verbally abusive when she was informed she would not be receiving a rx for narcotic pain reliever.  Pt refused to listen to d/c instructions or e-sign for d/c.  Security to bedside to escort pt out.

## 2016-02-11 NOTE — ED Notes (Addendum)
Pt requesting pain medication informed Dr Read Drivers of pt request.
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
## 2016-02-11 NOTE — ED Notes (Signed)
Called to room pt appears to be having seizure and back to normal talking in less than 30 seconds.

## 2016-06-24 ENCOUNTER — Emergency Department
Admission: EM | Admit: 2016-06-24 | Discharge: 2016-06-24 | Disposition: A | Discharge status: Home / Self Care | Payer: Medicaid Other | Attending: Emergency Medicine | Admitting: Emergency Medicine

## 2016-06-24 ENCOUNTER — Encounter: Payer: Self-pay | Admitting: Emergency Medicine

## 2016-06-24 DIAGNOSIS — Y9289 Other specified places as the place of occurrence of the external cause: Secondary | ICD-10-CM | POA: Diagnosis not present

## 2016-06-24 DIAGNOSIS — I10 Essential (primary) hypertension: Secondary | ICD-10-CM | POA: Insufficient documentation

## 2016-06-24 DIAGNOSIS — W260XXA Contact with knife, initial encounter: Secondary | ICD-10-CM | POA: Insufficient documentation

## 2016-06-24 DIAGNOSIS — Z79899 Other long term (current) drug therapy: Secondary | ICD-10-CM | POA: Insufficient documentation

## 2016-06-24 DIAGNOSIS — F17210 Nicotine dependence, cigarettes, uncomplicated: Secondary | ICD-10-CM | POA: Insufficient documentation

## 2016-06-24 DIAGNOSIS — Y999 Unspecified external cause status: Secondary | ICD-10-CM | POA: Diagnosis not present

## 2016-06-24 DIAGNOSIS — S61216A Laceration without foreign body of right little finger without damage to nail, initial encounter: Secondary | ICD-10-CM | POA: Diagnosis present

## 2016-06-24 DIAGNOSIS — E119 Type 2 diabetes mellitus without complications: Secondary | ICD-10-CM | POA: Insufficient documentation

## 2016-06-24 DIAGNOSIS — Y9389 Activity, other specified: Secondary | ICD-10-CM | POA: Insufficient documentation

## 2016-06-24 DIAGNOSIS — Z23 Encounter for immunization: Secondary | ICD-10-CM | POA: Diagnosis not present

## 2016-06-24 MED ORDER — TETANUS-DIPHTH-ACELL PERTUSSIS 5-2.5-18.5 LF-MCG/0.5 IM SUSP
0.5000 mL | Freq: Once | INTRAMUSCULAR | Status: AC
  Administered 2016-06-24: 0.5 mL via INTRAMUSCULAR
  Filled 2016-06-24: qty 0.5

## 2016-06-24 MED ORDER — LIDOCAINE HCL (PF) 1 % IJ SOLN
10.0000 mL | Freq: Once | INTRAMUSCULAR | Status: AC
  Administered 2016-06-24: 10 mL
  Filled 2016-06-24: qty 10

## 2016-06-24 MED ORDER — IBUPROFEN 800 MG PO TABS: 800.0000 mg | ORAL_TABLET | Freq: Three times a day (TID) | ORAL | 0 refills | Status: DC | PRN
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
## 2016-08-21 ENCOUNTER — Emergency Department (HOSPITAL_COMMUNITY)
Admission: EM | Admit: 2016-08-21 | Discharge: 2016-08-22 | Disposition: A | Discharge status: Home / Self Care | Payer: Medicaid Other | Attending: Emergency Medicine | Admitting: Emergency Medicine

## 2016-08-21 DIAGNOSIS — I10 Essential (primary) hypertension: Secondary | ICD-10-CM | POA: Diagnosis not present

## 2016-08-21 DIAGNOSIS — G40909 Epilepsy, unspecified, not intractable, without status epilepticus: Secondary | ICD-10-CM

## 2016-08-21 DIAGNOSIS — Z9114 Patient's other noncompliance with medication regimen: Secondary | ICD-10-CM | POA: Insufficient documentation

## 2016-08-21 DIAGNOSIS — E876 Hypokalemia: Secondary | ICD-10-CM | POA: Insufficient documentation

## 2016-08-21 DIAGNOSIS — E119 Type 2 diabetes mellitus without complications: Secondary | ICD-10-CM | POA: Insufficient documentation

## 2016-08-21 DIAGNOSIS — Z79899 Other long term (current) drug therapy: Secondary | ICD-10-CM | POA: Diagnosis not present

## 2016-08-21 DIAGNOSIS — R569 Unspecified convulsions: Secondary | ICD-10-CM | POA: Diagnosis present

## 2016-08-21 DIAGNOSIS — J111 Influenza due to unidentified influenza virus with other respiratory manifestations: Secondary | ICD-10-CM | POA: Insufficient documentation

## 2016-08-21 DIAGNOSIS — F17210 Nicotine dependence, cigarettes, uncomplicated: Secondary | ICD-10-CM | POA: Diagnosis not present

## 2016-08-21 MED ORDER — LORAZEPAM 2 MG/ML IJ SOLN
INTRAMUSCULAR | Status: AC
  Filled 2016-08-21: qty 1

## 2016-08-21 MED ORDER — SODIUM CHLORIDE 0.9 % IV SOLN
1000.0000 mg | Freq: Once | INTRAVENOUS | Status: DC
  Filled 2016-08-21: qty 10

## 2016-08-21 MED ORDER — LEVETIRACETAM 500 MG PO TABS
1000.0000 mg | ORAL_TABLET | Freq: Once | ORAL | Status: AC
  Administered 2016-08-22: 1000 mg via ORAL
  Filled 2016-08-21: qty 2

## 2016-08-21 NOTE — ED Notes (Signed)
Patient is refusing care at this moment, says, " don't bother, my mother is on her way and as soon as she gets here, I'm out of here."  Provider notified and is speaking with the patient.
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
## 2016-08-21 NOTE — ED Notes (Signed)
Bed: WA01 Expected date:  Expected time:  Means of arrival:  Comments: EMS- seizures 

## 2016-08-22 LAB — I-STAT CHEM 8, ED
BUN: 6 mg/dL (ref 6–20)
CALCIUM ION: 1.22 mmol/L (ref 1.15–1.40)
CREATININE: 0.7 mg/dL (ref 0.44–1.00)
Chloride: 107 mmol/L (ref 101–111)
Glucose, Bld: 103 mg/dL — ABNORMAL HIGH (ref 65–99)
HCT: 37 % (ref 36.0–46.0)
Hemoglobin: 12.6 g/dL (ref 12.0–15.0)
Potassium: 3.4 mmol/L — ABNORMAL LOW (ref 3.5–5.1)
SODIUM: 139 mmol/L (ref 135–145)
TCO2: 24 mmol/L (ref 0–100)

## 2016-08-22 LAB — I-STAT BETA HCG BLOOD, ED (MC, WL, AP ONLY): I-stat hCG, quantitative: <5 m[IU]/mL (ref <5)

## 2016-08-22 MED ORDER — POTASSIUM CHLORIDE CRYS ER 20 MEQ PO TBCR
40.0000 meq | EXTENDED_RELEASE_TABLET | Freq: Once | ORAL | Status: AC
  Administered 2016-08-22: 40 meq via ORAL
  Filled 2016-08-22: qty 2

## 2016-08-22 NOTE — Discharge Instructions (Signed)
Take your Keppra as prescribed until you can see the neurologist and he/she  changes your seizure medicine. Call [place] [facility] or [facility] on Monday, 08/24/16 to schedule next available appointment. Tell the neurologist that Keppra causes side effects which are difficult for you to tolerate. Discussed with him/her about switching your medicine. Take Tylenol for aches. No driving until okayed by the neurologist

## 2016-08-22 NOTE — ED Provider Notes (Addendum)
[facility]-EMERGENCY DEPT Provider Note   CSN: [id number] Arrival date & time: 08/21/16  2240     History   Chief Complaint Chief Complaint  Patient presents with  . Seizures    HPI Amanda Valentine is a 41 y.o. female.Patient brought to the emergency department after she had  multiple generalized seizures. This evening. Patient missed a noncompliance with Keppra for several weeks. Other associated symptoms include slight cough sore throat and diffuse myalgias for the past 2 or 3 days. She denies any shortness of breath. I examine patient immediately after she had a seizure upon arrival to the ED. She was initially postictal and sleepy and within 2 or 3 minutes he became alert , speaking appropriately  HPI  Past Medical History:  Diagnosis Date  . Anxiety   . Bipolar 1 disorder (HCC)   . Chronic bronchitis (HCC)   . Depression   . Diabetes mellitus without complication (HCC)   . Epilepsy (HCC)   . Hypertension     Patient Active Problem List   Diagnosis Date Noted  . Seizure (HCC) 11/27/2015  . Bipolar disorder (HCC) 11/27/2015  . Diabetes mellitus type 2 in nonobese (HCC) 11/27/2015  . GI bleed 11/27/2015  . Seizures (HCC) 11/27/2015  . Acute GI bleeding 11/27/2015    Past Surgical History:  Procedure Laterality Date  . TUBAL LIGATION      OB History    No data available       Home Medications    Prior to Admission medications   Medication Sig Start Date End Date Taking? Authorizing Provider  ALPRAZolam Prudy Feeler) 1 MG tablet Take 1 mg by mouth 3 (three) times daily as needed. For anxiety    Historical Provider, MD  buprenorphine-naloxone (SUBOXONE) 8-2 MG SUBL SL tablet Place 1 tablet under the tongue daily.    Historical Provider, MD  ibuprofen (ADVIL,MOTRIN) 800 MG tablet Take 1 tablet (800 mg total) by mouth every 8 (eight) hours as needed. 06/24/16   Delorise Royals Cuthriell, PA-C  levETIRAcetam (KEPPRA) 500 MG tablet Take 1 tablet (500 mg total) by mouth 2 (two) times  daily. 11/27/15   Rhetta Mura, MD  QUEtiapine Fumarate (SEROQUEL XR) 150 MG 24 hr tablet Take 75 mg by mouth at bedtime.    Historical Provider, MD  tiotropium (SPIRIVA) 18 MCG inhalation capsule Place 18 mcg into inhaler and inhale daily.    Historical Provider, MD    Family History Family History  Problem Relation Age of Onset  . Seizures Maternal Grandmother   . Diabetes Mellitus II Maternal Grandfather   . Hypertension Other   . CAD Other   . Cancer Other   . Kidney failure Other     Social History Social History  Substance Use Topics  . Smoking status: Current Every Day Smoker    Packs/day: 0.50    Types: Cigarettes  . Smokeless tobacco: Never Used  . Alcohol use Yes     Comment: occasionally  Denies drug use   Allergies   Depakote [valproic acid]; Penicillins; and Orange juice [orange oil]   Review of Systems Review of Systems  HENT: Positive for sore throat.   Respiratory: Positive for cough.   Musculoskeletal: Positive for myalgias.  Neurological: Positive for seizures.  All other systems reviewed and are negative.    Physical Exam Updated Vital Signs BP 105/72   Pulse 63   Resp 14   SpO2 98%   Physical Exam  Constitutional: She appears well-developed and well-nourished.  HENT:  Head: Normocephalic and atraumatic.  Eyes: Conjunctivae are normal. Pupils are equal, round, and reactive to light.  Neck: Neck supple. No tracheal deviation present. No thyromegaly present.  Cardiovascular: Normal rate and regular rhythm.   No murmur heard. Pulmonary/Chest: Effort normal and breath sounds normal.  Abdominal: Soft. Bowel sounds are normal. She exhibits no distension. There is no tenderness.  Musculoskeletal: Normal range of motion. She exhibits no edema or tenderness.  Neurological: She is alert. Coordination normal.  Skin: Skin is warm and dry. No rash noted.  Psychiatric:  .tearful  Nursing note and vitals reviewed.    ED Treatments /  Results  Labs (all labs ordered are listed, but only abnormal results are displayed) Labs Reviewed  I-STAT CHEM 8, ED - Abnormal; Notable for the following:       Result Value   Potassium 3.4 (*)    Glucose, Bld 103 (*)    All other components within normal limits  I-STAT BETA HCG BLOOD, ED ([facility], [facility], [facility] ONLY)   Results for orders placed or performed during the hospital encounter of 08/21/16  I-Stat Beta hCG blood, ED ([facility], [facility], [facility] only)  Result Value Ref Range   I-stat hCG, quantitative <5.0 <5 mIU/mL   Comment 3          I-stat chem 8, ed  Result Value Ref Range   Sodium 139 135 - 145 mmol/L   Potassium 3.4 (L) 3.5 - 5.1 mmol/L   Chloride 107 101 - 111 mmol/L   BUN 6 6 - 20 mg/dL   Creatinine, Ser 8.11 0.44 - 1.00 mg/dL   Glucose, Bld 914 (H) 65 - 99 mg/dL   Calcium, Ion 7.82 9.56 - 1.40 mmol/L   TCO2 24 0 - 100 mmol/L   Hemoglobin 12.6 12.0 - 15.0 g/dL   HCT 21.3 08.6 - 57.8 %   No results found.EKG  EKG Interpretation  Date/Time:  Thursday August 21 2016 23:07:31 EST Ventricular Rate:  76 PR Interval:    QRS Duration: 82 QT Interval:  389 QTC Calculation: 438 R Axis:   72 Text Interpretation:  Sinus rhythm Probable anteroseptal infarct, old No significant change since last tracing Confirmed by Ethelda Chick  MD, Imberly Troxler [phone]) on 08/21/2016 11:16:24 PM       Radiology No results found.  Procedures Procedures (including critical care time)  Medications Ordered in ED Medications  LORazepam (ATIVAN) 2 MG/ML injection (not administered)  potassium chloride SA (K-DUR,KLOR-CON) CR tablet 40 mEq (not administered)  levETIRAcetam (KEPPRA) tablet 1,000 mg (1,000 mg Oral Given 08/22/16 0003)     Initial Impression / Assessment and Plan / ED Course  I have reviewed the triage vital signs and the nursing notes.  Pertinent labs & imaging results that were available during my care of the patient were reviewed by me and considered in my medical decision making (see chart for  details).     Patient refused IV Keppra load but accepted oral Keppra. She refused lab work except for i-STAT 8. At 12:05 AM he is alert appropriate Glasgow Coma Score 15 and ambulates without difficulty.  Patient reports that Keppra makes her feel dizzy therefore she hasn't taken it. I suggest that she continue to take Keppra until she sees her neurologist to discuss side its effects. She reportedly has had a bad reaction to Depakote in the past She received oral potassium supplement while here Final Clinical Impressions(s) / ED Diagnoses  Diagnoses #1 seizure disorder #2 medication noncompliance #3 hypokalemia #  4 influenza-like illness Final diagnoses:  None    New Prescriptions New Prescriptions   No medications on file     Doug SouSam Jahmar Mckelvy, MD 08/22/16 [phone]    Doug SouSam Juwon Scripter, MD 08/22/16 [id number]

## 2017-02-14 ENCOUNTER — Encounter (HOSPITAL_COMMUNITY): Payer: Self-pay | Admitting: *Deleted

## 2017-02-14 ENCOUNTER — Emergency Department (HOSPITAL_COMMUNITY)
Admission: EM | Admit: 2017-02-14 | Discharge: 2017-02-14 | Disposition: A | Discharge status: Home / Self Care | Payer: Medicaid Other | Attending: Emergency Medicine | Admitting: Emergency Medicine

## 2017-02-14 DIAGNOSIS — E119 Type 2 diabetes mellitus without complications: Secondary | ICD-10-CM | POA: Insufficient documentation

## 2017-02-14 DIAGNOSIS — H5713 Ocular pain, bilateral: Secondary | ICD-10-CM | POA: Diagnosis present

## 2017-02-14 DIAGNOSIS — I10 Essential (primary) hypertension: Secondary | ICD-10-CM | POA: Insufficient documentation

## 2017-02-14 DIAGNOSIS — F17210 Nicotine dependence, cigarettes, uncomplicated: Secondary | ICD-10-CM | POA: Diagnosis not present

## 2017-02-14 DIAGNOSIS — H1033 Unspecified acute conjunctivitis, bilateral: Secondary | ICD-10-CM | POA: Diagnosis not present

## 2017-02-14 DIAGNOSIS — Z79899 Other long term (current) drug therapy: Secondary | ICD-10-CM | POA: Insufficient documentation

## 2017-02-14 MED ORDER — FLUORESCEIN SODIUM 0.6 MG OP STRP
1.0000 | ORAL_STRIP | Freq: Once | OPHTHALMIC | Status: AC
  Administered 2017-02-14: 1 via OPHTHALMIC
  Filled 2017-02-14: qty 1

## 2017-02-14 MED ORDER — CIPROFLOXACIN HCL 0.3 % OP SOLN: 1.0000 [drp] | OPHTHALMIC | 0 refills | Status: DC

## 2017-02-14 MED ORDER — FEXOFENADINE HCL 60 MG PO TABS: 60.0000 mg | ORAL_TABLET | Freq: Two times a day (BID) | ORAL | 0 refills | Status: DC

## 2017-02-14 MED ORDER — TETRACAINE HCL 0.5 % OP SOLN
2.0000 [drp] | Freq: Once | OPHTHALMIC | Status: AC
  Administered 2017-02-14: 2 [drp] via OPHTHALMIC
  Filled 2017-02-14: qty 4

## 2017-02-14 NOTE — ED Notes (Signed)
Pt started 3 days ago with eye irritation, drainage and redness. Had worn color contacts prior to this. Pt very anxious/crying. Eyes very red.

## 2017-02-14 NOTE — Discharge Instructions (Signed)
Please take medication as prescribed. Please follow up with PCP in one week for persistent symptoms. If you develop worsening or new concerning symptoms you can return to the emergency department for re-evaluation.

## 2017-02-14 NOTE — ED Provider Notes (Signed)
[facility]-EMERGENCY DEPT Provider Note   CSN: [id number] Arrival date & time: 02/14/17  1047     History   Chief Complaint Chief Complaint  Patient presents with  . Eye Pain    HPI Amanda Valentine is a 41 y.o. female presents to the emergency department today for bilateral eye redness and irritation 2 days. The patient states that she awoke 2 days ago with her right eye crusted shut. She states that since then the eyes been red, irritated ("feels like sandpaper"). She states yesterday this transferred into the left eye and she awoke with both eyes crusted shut. She has also had mild clear/mucopurulent drainage from both eyes since then. She states that she does wear contacts, however these are colored contacts that she has been using for several months and only wears them when she goes out. Most recent wearing was 4 days ago. Has not been in any bodies of water recently. She has not tried anything for this. She did she notes associated congestion, runny nose, sneezing. No sick contacts or trauma. She denies vision loss, floaters, painful eye movements, headache, rash, fever, nausea, vomiting. No previous eye surgeries. Not followed by an ophthalmologist.   HPI  Past Medical History:  Diagnosis Date  . Anxiety   . Bipolar 1 disorder (HCC)   . Chronic bronchitis (HCC)   . Depression   . Diabetes mellitus without complication (HCC)   . Epilepsy (HCC)   . Hypertension     Patient Active Problem List   Diagnosis Date Noted  . Seizure (HCC) 11/27/2015  . Bipolar disorder (HCC) 11/27/2015  . Diabetes mellitus type 2 in nonobese (HCC) 11/27/2015  . GI bleed 11/27/2015  . Seizures (HCC) 11/27/2015  . Acute GI bleeding 11/27/2015    Past Surgical History:  Procedure Laterality Date  . TUBAL LIGATION      OB History    No data available       Home Medications    Prior to Admission medications   Medication Sig Start Date End Date Taking? Authorizing Provider  ALPRAZolam Prudy Feeler) 1  MG tablet Take 1 mg by mouth 3 (three) times daily as needed. For anxiety    [provider]  buprenorphine-naloxone (SUBOXONE) 8-2 MG SUBL SL tablet Place 1 tablet under the tongue daily.    [provider]  ciprofloxacin (CILOXAN) 0.3 % ophthalmic solution Place 1 drop into both eyes every 2 (two) hours while awake. Administer 1 drop, every 2 hours, while awake, for 2 days. Then 1 drop, every 4 hours, while awake, for the next 5 days. 02/14/17   Windell Musson, Elmer Sow, PA-C  fexofenadine (ALLEGRA) 60 MG tablet Take 1 tablet (60 mg total) by mouth 2 (two) times daily. 02/14/17   Hilary Pundt, Elmer Sow, PA-C  ibuprofen (ADVIL,MOTRIN) 800 MG tablet Take 1 tablet (800 mg total) by mouth every 8 (eight) hours as needed. 06/24/16   Cuthriell, Delorise Royals, PA-C  levETIRAcetam (KEPPRA) 500 MG tablet Take 1 tablet (500 mg total) by mouth 2 (two) times daily. 11/27/15   Rhetta Mura, MD  QUEtiapine Fumarate (SEROQUEL XR) 150 MG 24 hr tablet Take 75 mg by mouth at bedtime.    [provider]  tiotropium (SPIRIVA) 18 MCG inhalation capsule Place 18 mcg into inhaler and inhale daily.    [provider]    Family History Family History  Problem Relation Age of Onset  . Seizures Maternal Grandmother   . Diabetes Mellitus II Maternal Grandfather   .  Hypertension Other   . CAD Other   . Cancer Other   . Kidney failure Other     Social History Social History  Substance Use Topics  . Smoking status: Current Every Day Smoker    Packs/day: 0.50    Types: Cigarettes  . Smokeless tobacco: Never Used  . Alcohol use Yes     Comment: occasionally     Allergies   Depakote [valproic acid]; Penicillins; and Orange juice [orange oil]   Review of Systems Review of Systems   Physical Exam Updated Vital Signs BP (!) 111/92 (BP Location: Left Arm)   Pulse 80   Temp 97.9 F (36.6 C) (Oral)   Resp 20   SpO2 98%   Physical Exam  Constitutional: She appears well-developed  and well-nourished.  HENT:  Head: Normocephalic and atraumatic.  Right Ear: Hearing, tympanic membrane, external ear and ear canal normal.  Left Ear: Hearing, tympanic membrane, external ear and ear canal normal.  Nose: Rhinorrhea present.  Mouth/Throat: Uvula is midline, oropharynx is clear and moist and mucous membranes are normal.  Eyes: Pupils are equal, round, and reactive to light. EOM and lids are normal. Lids are everted and swept, no foreign bodies found. Right eye exhibits discharge. Right eye exhibits no chemosis. No foreign body present in the right eye. Left eye exhibits no chemosis and no discharge. No foreign body present in the left eye. Right conjunctiva is injected. Left conjunctiva is injected. No scleral icterus.  No ptosis, chemosis, lagophthalmos noted. No periorbital swelling or erythema. No discharge noted. Fluorescein dye without uptake. No seidel sign. Tonopen pressures 16-19 on right and 18-19 on the left.   Pulmonary/Chest: Effort normal. No respiratory distress.  Lymphadenopathy:       Head (right side): No preauricular adenopathy present.       Head (left side): No preauricular adenopathy present.    She has no cervical adenopathy.  Neurological: She is alert.  Skin: No pallor.  Psychiatric: Her mood appears anxious.  Crying  Nursing note and vitals reviewed.    ED Treatments / Results  Labs (all labs ordered are listed, but only abnormal results are displayed) Labs Reviewed - No data to display  EKG  EKG Interpretation None       Radiology No results found.  Procedures Procedures (including critical care time)  Medications Ordered in ED Medications  fluorescein ophthalmic strip 1 strip (1 strip Both Eyes Given 02/14/17 1109)  tetracaine (PONTOCAINE) 0.5 % ophthalmic solution 2 drop (2 drops Both Eyes Given 02/14/17 1109)     Initial Impression / Assessment and Plan / ED Course  I have reviewed the triage vital signs and the nursing  notes.  Pertinent labs & imaging results that were available during my care of the patient were reviewed by me and considered in my medical decision making (see chart for details).     41 year old female with likely conjunctivitis based on presentation and eye exam. Antibiotic drops given with coverage for Pseudomonas as patient is a contact lens wearer. No evidence of HSV or VSV infection.  Exam non-concerning for orbital cellulitis, hyphema, corneal ulcers, corneal abrasions or trauma.  Patient has been instructed to use cool compresses and practice personal hygiene with frequent hand washing.  Patient understands to follow up with ophthalmology, especially if new symptoms including change in vision, or entrapment occur. All questions answered. No further questions at this time. The patient is hemodynamically stable, mentating appropriately and appears safe for discharge.  Final Clinical Impressions(s) / ED Diagnoses   Final diagnoses:  Acute conjunctivitis of both eyes, unspecified acute conjunctivitis type    New Prescriptions Discharge Medication List as of 02/14/2017 11:42 AM    START taking these medications   Details  ciprofloxacin (CILOXAN) 0.3 % ophthalmic solution Place 1 drop into both eyes every 2 (two) hours while awake. Administer 1 drop, every 2 hours, while awake, for 2 days. Then 1 drop, every 4 hours, while awake, for the next 5 days., Starting Sat 02/14/2017, Print    fexofenadine (ALLEGRA) 60 MG tablet Take 1 tablet (60 mg total) by mouth 2 (two) times daily., Starting Sat 02/14/2017, Print         Jacinto HalimMaczis, Stellan Vick M, PA-C 02/14/17 1304    Gerhard MunchLockwood, Robert, MD 02/14/17 [phone]

## 2017-02-14 NOTE — ED Triage Notes (Signed)
Pt reports bilateral eye redness and irritation x 2 days. No vision changes.

## 2017-03-03 ENCOUNTER — Encounter: Payer: Self-pay | Admitting: *Deleted

## 2017-03-03 ENCOUNTER — Emergency Department: Payer: Medicaid Other

## 2017-03-03 ENCOUNTER — Emergency Department
Admission: EM | Admit: 2017-03-03 | Discharge: 2017-03-03 | Disposition: A | Discharge status: Home / Self Care | Payer: Medicaid Other | Attending: Emergency Medicine | Admitting: Emergency Medicine

## 2017-03-03 DIAGNOSIS — F17210 Nicotine dependence, cigarettes, uncomplicated: Secondary | ICD-10-CM | POA: Insufficient documentation

## 2017-03-03 DIAGNOSIS — I10 Essential (primary) hypertension: Secondary | ICD-10-CM | POA: Insufficient documentation

## 2017-03-03 DIAGNOSIS — R0789 Other chest pain: Secondary | ICD-10-CM | POA: Insufficient documentation

## 2017-03-03 DIAGNOSIS — E119 Type 2 diabetes mellitus without complications: Secondary | ICD-10-CM | POA: Insufficient documentation

## 2017-03-03 HISTORY — DX: Calculus of kidney: N20.0

## 2017-03-03 LAB — URINALYSIS, COMPLETE (UACMP) WITH MICROSCOPIC
BILIRUBIN URINE: NEGATIVE
Bacteria, UA: NONE SEEN
GLUCOSE, UA: NEGATIVE mg/dL
HGB URINE DIPSTICK: NEGATIVE
KETONES UR: NEGATIVE mg/dL
LEUKOCYTES UA: NEGATIVE
NITRITE: NEGATIVE
PH: 7 (ref 5.0–8.0)
PROTEIN: NEGATIVE mg/dL
RBC / HPF: NONE SEEN RBC/hpf (ref 0–5)
Specific Gravity, Urine: 1.004 — ABNORMAL LOW (ref 1.005–1.030)

## 2017-03-03 LAB — BASIC METABOLIC PANEL
Anion gap: 7 (ref 5–15)
BUN: 9 mg/dL (ref 6–20)
CHLORIDE: 107 mmol/L (ref 101–111)
CO2: 23 mmol/L (ref 22–32)
CREATININE: 0.59 mg/dL (ref 0.44–1.00)
Calcium: 9.7 mg/dL (ref 8.9–10.3)
GFR calc Af Amer: >60 mL/min (ref >60)
GLUCOSE: 90 mg/dL (ref 65–99)
POTASSIUM: 4.2 mmol/L (ref 3.5–5.1)
Sodium: 137 mmol/L (ref 135–145)

## 2017-03-03 LAB — CBC
HCT: 38.3 % (ref 35.0–47.0)
HEMOGLOBIN: 13.5 g/dL (ref 12.0–16.0)
MCH: 33 pg (ref 26.0–34.0)
MCHC: 35.1 g/dL (ref 32.0–36.0)
MCV: 93.9 fL (ref 80.0–100.0)
PLATELETS: 290 10*3/uL (ref 150–440)
RBC: 4.08 MIL/uL (ref 3.80–5.20)
RDW: 13 % (ref 11.5–14.5)
WBC: 5.8 10*3/uL (ref 3.6–11.0)

## 2017-03-03 LAB — URINE DRUG SCREEN, QUALITATIVE (ARMC ONLY)
AMPHETAMINES, UR SCREEN: NOT DETECTED
Barbiturates, Ur Screen: NOT DETECTED
Benzodiazepine, Ur Scrn: POSITIVE — AB
COCAINE METABOLITE, UR ~~LOC~~: NOT DETECTED
Cannabinoid 50 Ng, Ur ~~LOC~~: NOT DETECTED
MDMA (Ecstasy)Ur Screen: NOT DETECTED
METHADONE SCREEN, URINE: NOT DETECTED
OPIATE, UR SCREEN: NOT DETECTED
PHENCYCLIDINE (PCP) UR S: NOT DETECTED
Tricyclic, Ur Screen: NOT DETECTED

## 2017-03-03 LAB — POCT PREGNANCY, URINE: Preg Test, Ur: NEGATIVE

## 2017-03-03 LAB — TROPONIN I

## 2017-03-03 NOTE — ED Triage Notes (Signed)
Pt to ED reporting left sided chest pain that radiates to the center of her chest and down left arm. PT reports dizziness, lightheadedness, SOB and vomiting over the past 3 days. Pt reports pain increases with movement and is tender upon palpation. No distress noted at this time.   Pt also reports having had a seizure 2 days ago. Hx of seizures. Pt requesting to be seen for "40 kidney stones on right side." Pt reports they are hurting and her right flank hurts and she has seen blood in her urine.

## 2017-03-03 NOTE — ED Notes (Signed)
Pt verbalizes d/c teaching, denies any further questions. Pt in NAD at time of d/c , VS stbale

## 2017-03-03 NOTE — ED Provider Notes (Signed)
[facility] Emergency Department Provider Note  ____________________________________________   I have reviewed the triage vital signs and the nursing notes.   HISTORY  Chief Complaint Chest Pain    HPI Amanda Valentine is a 41 y.o. female presents today with multiple different  Complaints.  She has a history of seizures and had a seizure a few days ago. She is compliant with her medications she states this is not unusual for her. She did not injure herself in the seizure.`she knows not to drive and I have re-emphasized this and all seizure precautions.  In addition, she says, for the last 3 days uninterruptedly when she has muscle pain in her left chest. It is most notable when she takes her right arm and uses it to pull her left arm over her body. No fever no chills no n/v no diarrhea no flank pain no sob no exertional sx no recollected injury. Pain is non stop. "like I pulled something but I don't remember doing it."  Also has a history of kidney stones and, while she has no complaint related to that, she shares a sense of grievance that her physician has not cured her of this yet. No flank pain no fever no cough no hematuria.     Past Medical History:  Diagnosis Date  . Anxiety   . Bipolar 1 disorder (HCC)   . Chronic bronchitis (HCC)   . Depression   . Diabetes mellitus without complication (HCC)   . Epilepsy (HCC)   . Hypertension   . Kidney calculi     Patient Active Problem List   Diagnosis Date Noted  . Seizure (HCC) 11/27/2015  . Bipolar disorder (HCC) 11/27/2015  . Diabetes mellitus type 2 in nonobese (HCC) 11/27/2015  . GI bleed 11/27/2015  . Seizures (HCC) 11/27/2015  . Acute GI bleeding 11/27/2015    Past Surgical History:  Procedure Laterality Date  . TUBAL LIGATION      Prior to Admission medications   Medication Sig Start Date End Date Taking? Authorizing Provider  ALPRAZolam Prudy Feeler) 1 MG tablet Take 1 mg by mouth 3 (three) times  daily as needed. For anxiety    [provider]  buprenorphine-naloxone (SUBOXONE) 8-2 MG SUBL SL tablet Place 1 tablet under the tongue daily.    [provider]  ciprofloxacin (CILOXAN) 0.3 % ophthalmic solution Place 1 drop into both eyes every 2 (two) hours while awake. Administer 1 drop, every 2 hours, while awake, for 2 days. Then 1 drop, every 4 hours, while awake, for the next 5 days. 02/14/17   Maczis, Elmer Sow, PA-C  fexofenadine (ALLEGRA) 60 MG tablet Take 1 tablet (60 mg total) by mouth 2 (two) times daily. 02/14/17   Maczis, Elmer Sow, PA-C  ibuprofen (ADVIL,MOTRIN) 800 MG tablet Take 1 tablet (800 mg total) by mouth every 8 (eight) hours as needed. 06/24/16   Cuthriell, Delorise Royals, PA-C  levETIRAcetam (KEPPRA) 500 MG tablet Take 1 tablet (500 mg total) by mouth 2 (two) times daily. 11/27/15   Rhetta Mura, MD  QUEtiapine Fumarate (SEROQUEL XR) 150 MG 24 hr tablet Take 75 mg by mouth at bedtime.    [provider]  tiotropium (SPIRIVA) 18 MCG inhalation capsule Place 18 mcg into inhaler and inhale daily.    [provider]    Allergies Acyclovir and related; Depakote [valproic acid]; Penicillins; and Orange juice [orange oil]  Family History  Problem Relation Age of Onset  . Seizures Maternal Grandmother   .  Diabetes Mellitus II Maternal Grandfather   . Hypertension Other   . CAD Other   . Cancer Other   . Kidney failure Other     Social History Social History  Substance Use Topics  . Smoking status: Current Every Day Smoker    Packs/day: 0.50    Types: Cigarettes  . Smokeless tobacco: Never Used  . Alcohol use Yes     Comment: occasionally    Review of Systems  Constitutional: No fever/chills Eyes: No visual changes. ENT: No sore throat. No stiff neck no neck pain Cardiovascular: + chest pain. Respiratory: Denies shortness of breath. Gastrointestinal:   no vomiting.  No diarrhea.  No constipation. Genitourinary: Negative  for dysuria. Musculoskeletal: Negative lower extremity swelling Skin: Negative for rash. Neurological: Negative for severe headaches, focal weakness or numbness.   ____________________________________________   PHYSICAL EXAM:  VITAL SIGNS: ED Triage Vitals  Enc Vitals Group     BP 03/03/17 1413 108/82     Pulse Rate 03/03/17 1413 71     Resp 03/03/17 1413 16     Temp 03/03/17 1433 98.2 F (36.8 C)     Temp Source 03/03/17 1433 Oral     SpO2 03/03/17 1413 98 %     Weight 03/03/17 1113 156 lb (70.8 kg)     Height 03/03/17 1113 5\' 1"  (1.549 m)     Head Circumference --      Peak Flow --      Pain Score 03/03/17 1111 9     Pain Loc --      Pain Edu? --      Excl. in GC? --     Constitutional: Alert and oriented. Well appearing and in no acute distress. Eyes: Conjunctivae are normal Head: Atraumatic HEENT: No congestion/rhinnorhea. Mucous membranes are moist.  Oropharynx non-erythematous Neck:   Nontender with no meningismus, no masses, no stridor Cardiovascular: Normal rate, regular rhythm. Grossly normal heart sounds.  Good peripheral circulation. Chest: tender to palpation in left chest. Husband there for chaparone.  They decline female nurse caparone. (he states "just don't fondle both of them." I did explain that I was not going to do that to either. When I touch the area posterior and lateral to the L breast there is tenderness. There is no crepitous flail chest or evidence of rib fracture. It is worse when she moves her arms.  Respiratory: Normal respiratory effort.  No retractions. Lungs CTAB. Abdominal: Soft and nontender. No distention. No guarding no rebound Back:  There is no focal tenderness or step off.  there is no midline tenderness there are no lesions noted. there is no CVA tenderness Musculoskeletal: No lower extremity tenderness, no upper extremity tenderness. No joint effusions, no DVT signs strong distal pulses no edema Neurologic:  Normal speech and  language. No gross focal neurologic deficits are appreciated.  Skin:  Skin is warm, dry and intact. No rash noted. Psychiatric: Mood and affect are normal. Speech and behavior are normal.  ____________________________________________   LABS (all labs ordered are listed, but only abnormal results are displayed)  Labs Reviewed  URINALYSIS, COMPLETE (UACMP) WITH MICROSCOPIC - Abnormal; Notable for the following:       Result Value   Color, Urine STRAW (*)    APPearance CLEAR (*)    Specific Gravity, Urine 1.004 (*)    Squamous Epithelial / LPF 0-5 (*)    All other components within normal limits  BASIC METABOLIC PANEL  CBC  TROPONIN I  URINE  DRUG SCREEN, QUALITATIVE ([facility] ONLY)  POC URINE PREG, ED  POCT PREGNANCY, URINE   ____________________________________________  EKG  I personally interpreted any EKGs ordered by me or triage NSR rate 77 no ste no std normal ecg ____________________________________________  RADIOLOGY  I reviewed any imaging ordered by me or triage that were performed during my shift and, if possible, patient and/or family made aware of any abnormal findings. ____________________________________________   PROCEDURES  Procedure(s) performed: None  Procedures  Critical Care performed: None  ____________________________________________   INITIAL IMPRESSION / ASSESSMENT AND PLAN / ED COURSE  Pertinent labs & imaging results that were available during my care of the patient were reviewed by me and considered in my medical decision making (see chart for details).  Pt with reproducible chest wall pain. At this time, there does not appear to be clinical evidence to support the diagnosis of pulmonary embolus, dissection, myocarditis, endocarditis, pericarditis, pericardial tamponade, acute coronary syndrome, pneumothorax, pneumonia, or any other acute intrathoracic pathology that will require admission or acute intervention. Nor is there evidence of any  significant intra-abdominal pathology causing this discomfort. Advising nsaid and prn f/u. Seizure precautions explained.     ____________________________________________   FINAL CLINICAL IMPRESSION(S) / ED DIAGNOSES  Final diagnoses:  None      This chart was dictated using voice recognition software.  Despite best efforts to proofread,  errors can occur which can change meaning.      Jeanmarie Plant, MD 03/03/17 [phone]
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
## 2017-06-05 ENCOUNTER — Encounter (HOSPITAL_COMMUNITY): Payer: Self-pay | Admitting: Emergency Medicine

## 2017-06-05 ENCOUNTER — Emergency Department (HOSPITAL_COMMUNITY)
Admission: EM | Admit: 2017-06-05 | Discharge: 2017-06-05 | Disposition: A | Discharge status: Home / Self Care | Payer: Medicaid Other | Attending: Emergency Medicine | Admitting: Emergency Medicine

## 2017-06-05 ENCOUNTER — Other Ambulatory Visit: Payer: Self-pay

## 2017-06-05 DIAGNOSIS — F319 Bipolar disorder, unspecified: Secondary | ICD-10-CM | POA: Insufficient documentation

## 2017-06-05 DIAGNOSIS — Z760 Encounter for issue of repeat prescription: Secondary | ICD-10-CM | POA: Insufficient documentation

## 2017-06-05 DIAGNOSIS — E119 Type 2 diabetes mellitus without complications: Secondary | ICD-10-CM | POA: Diagnosis not present

## 2017-06-05 DIAGNOSIS — Z79899 Other long term (current) drug therapy: Secondary | ICD-10-CM | POA: Diagnosis not present

## 2017-06-05 DIAGNOSIS — F419 Anxiety disorder, unspecified: Secondary | ICD-10-CM | POA: Diagnosis not present

## 2017-06-05 DIAGNOSIS — F17210 Nicotine dependence, cigarettes, uncomplicated: Secondary | ICD-10-CM | POA: Diagnosis not present

## 2017-06-05 DIAGNOSIS — I10 Essential (primary) hypertension: Secondary | ICD-10-CM | POA: Insufficient documentation

## 2017-06-05 MED ORDER — ALPRAZOLAM 0.5 MG PO TABS: 0.5000 mg | ORAL_TABLET | Freq: Three times a day (TID) | ORAL | 0 refills | Status: DC | PRN

## 2017-06-05 MED ORDER — LEVETIRACETAM 500 MG PO TABS: 500.0000 mg | ORAL_TABLET | Freq: Two times a day (BID) | ORAL | 0 refills | Status: DC

## 2017-06-05 MED ORDER — ALPRAZOLAM 0.5 MG PO TABS
0.5000 mg | ORAL_TABLET | Freq: Once | ORAL | Status: AC
Start: 2017-06-05 — End: 2017-06-05
  Administered 2017-06-05: 0.5 mg via ORAL
  Filled 2017-06-05: qty 1

## 2017-06-05 NOTE — ED Notes (Signed)
Pt verbalizes understanding of d/c instructions.  VSS.
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
## 2017-06-05 NOTE — ED Provider Notes (Signed)
[facility] EMERGENCY DEPARTMENT Provider Note   CSN: [id number] Arrival date & time: 06/05/17  4098     History   Chief Complaint Chief Complaint  Patient presents with  . Medication Refill    HPI Amanda Valentine is a 41 y.o. female.  HPI Amanda Valentine is a 41 y.o. female with history of bipolar disorder, anxiety, diabetes, hypertension, epilepsy, presents to emergency department complaining of anxiety and running out of medicines.  Patient states she ran out of her Xanax and Keppra approximately a month ago.  Patient states that her physician has shut down his practice and she is waiting on a new appointment with a new provider that is on December 1.  She states she is unable to get her medications refilled.  She states she is still taking Seroquel and her asthma medications.  She states that recently her husband was involved in a very bad motor vehicle accident, still in ICU in the hospital.  States they are supposed to take his breathing tube out and she believes that he is going to pass away.  She states he has no brain activity and it is causing her severe anxiety and states that she is unable to cope.  She is requesting medications for her anxiety to be refilled and states that she is worried she is going to have a seizure.  Patient states she used to take Xanax, she was prescribed this by her primary care doctor every month for several years.  She has not had any in 1 month.  She denies any suicidal or homicidal  ideations.  She states that she did have several seizures a week ago, but none in the last few days.  She has no other complaints.  Past Medical History:  Diagnosis Date  . Anxiety   . Bipolar 1 disorder (HCC)   . Chronic bronchitis (HCC)   . Depression   . Diabetes mellitus without complication (HCC)    diet controlled  . Epilepsy (HCC)   . Hypertension   . Kidney calculi     Patient Active Problem List   Diagnosis Date Noted  . Seizure (HCC)  11/27/2015  . Bipolar disorder (HCC) 11/27/2015  . Diabetes mellitus type 2 in nonobese (HCC) 11/27/2015  . GI bleed 11/27/2015  . Seizures (HCC) 11/27/2015  . Acute GI bleeding 11/27/2015    Past Surgical History:  Procedure Laterality Date  . TUBAL LIGATION      OB History    No data available       Home Medications    Prior to Admission medications   Medication Sig Start Date End Date Taking? Authorizing Provider  ALPRAZolam Prudy Feeler) 1 MG tablet Take 1 mg by mouth 3 (three) times daily as needed. For anxiety    [provider]  buprenorphine-naloxone (SUBOXONE) 8-2 MG SUBL SL tablet Place 1 tablet under the tongue daily.    [provider]  ciprofloxacin (CILOXAN) 0.3 % ophthalmic solution Place 1 drop into both eyes every 2 (two) hours while awake. Administer 1 drop, every 2 hours, while awake, for 2 days. Then 1 drop, every 4 hours, while awake, for the next 5 days. 02/14/17   Maczis, Elmer Sow, PA-C  fexofenadine (ALLEGRA) 60 MG tablet Take 1 tablet (60 mg total) by mouth 2 (two) times daily. 02/14/17   Maczis, Elmer Sow, PA-C  ibuprofen (ADVIL,MOTRIN) 800 MG tablet Take 1 tablet (800 mg total) by mouth every 8 (eight) hours as needed.  06/24/16   Cuthriell, Delorise RoyalsJonathan D, PA-C  levETIRAcetam (KEPPRA) 500 MG tablet Take 1 tablet (500 mg total) by mouth 2 (two) times daily. 11/27/15   Rhetta MuraSamtani, Jai-Gurmukh, MD  QUEtiapine Fumarate (SEROQUEL XR) 150 MG 24 hr tablet Take 75 mg by mouth at bedtime.    [provider]  tiotropium (SPIRIVA) 18 MCG inhalation capsule Place 18 mcg into inhaler and inhale daily.    [provider]    Family History Family History  Problem Relation Age of Onset  . Seizures Maternal Grandmother   . Diabetes Mellitus II Maternal Grandfather   . Hypertension Other   . CAD Other   . Cancer Other   . Kidney failure Other     Social History Social History   Tobacco Use  . Smoking status: Current Every Day Smoker     Packs/day: 0.50    Types: Cigarettes  . Smokeless tobacco: Never Used  Substance Use Topics  . Alcohol use: Yes    Comment: occasionally  . Drug use: No     Allergies   Acyclovir and related; Depakote [valproic acid]; Penicillins; and Orange juice [orange oil]   Review of Systems Review of Systems  Constitutional: Negative for chills and fever.  Respiratory: Negative for cough, chest tightness and shortness of breath.   Cardiovascular: Negative for chest pain, palpitations and leg swelling.  Gastrointestinal: Negative for abdominal pain, diarrhea, nausea and vomiting.  Genitourinary: Negative for dysuria, flank pain and pelvic pain.  Musculoskeletal: Negative for arthralgias, myalgias, neck pain and neck stiffness.  Skin: Negative for rash.  Neurological: Positive for seizures. Negative for dizziness, weakness and headaches.  Psychiatric/Behavioral: The patient is nervous/anxious.   All other systems reviewed and are negative.    Physical Exam Updated Vital Signs BP (!) 124/95   Pulse 71   Temp 98.5 F (36.9 C) (Oral)   Resp 18   Ht 5\' 1"  (1.549 m)   Wt 71.7 kg (158 lb)   SpO2 100%   BMI 29.85 kg/m   Physical Exam  Constitutional: She appears well-developed and well-nourished.  Very tearful  HENT:  Head: Normocephalic.  Eyes: Conjunctivae are normal.  Neck: Neck supple.  Cardiovascular: Normal rate, regular rhythm and normal heart sounds.  Pulmonary/Chest: Effort normal and breath sounds normal. No respiratory distress. She has no wheezes. She has no rales.  Abdominal: Soft. Bowel sounds are normal. She exhibits no distension. There is no tenderness. There is no rebound.  Musculoskeletal: She exhibits no edema.  Neurological: She is alert.  Skin: Skin is warm and dry.  Psychiatric: Her behavior is normal.  Tearful  Nursing note and vitals reviewed.    ED Treatments / Results  Labs (all labs ordered are listed, but only abnormal results are  displayed) Labs Reviewed - No data to display  EKG  EKG Interpretation None       Radiology No results found.  Procedures Procedures (including critical care time)  Medications Ordered in ED Medications  ALPRAZolam (XANAX) tablet 0.5 mg (not administered)     Initial Impression / Assessment and Plan / ED Course  I have reviewed the triage vital signs and the nursing notes.  Pertinent labs & imaging results that were available during my care of the patient were reviewed by me and considered in my medical decision making (see chart for details).     Patient seen and examined.  Patient is basically here to get her Xanax and Keppra refilled.  She is currently dealing with  a lot of stressors, her husband is sick with a life-threatening condition in the hospital.  She is very tearful during exam.  I did look her up on controlled substance database, and it does appear to be that patient was prescribed Xanax monthly by her primary care doctor up until August.  That was the last prescription 03/13/2017.  Patient is also on Suboxone. I explained to her that we do not refill these type of medications in emergency department.  I advised her that we will give her a prescription for a few tablets until she is able to follow-up somewhere else.  I advised her that she could go to behavioral health or [facility] if she is having anxiety and depression or coping issues.  I will refill her Keppra.  Pt agreed to the plan. At this time no SI or HI. VS normal. Stable for dc home.   Vitals:   06/05/17 1145 06/05/17 1200 06/05/17 1210 06/05/17 1213  BP: (!) 121/91 (!) 124/95    Pulse: 70 71 66 60  Resp:  18    Temp:      TempSrc:      SpO2: 99% 100% 99% 99%  Weight:      Height:        Final Clinical Impressions(s) / ED Diagnoses   Final diagnoses:  Encounter for medication refill  Anxiety    ED Discharge Orders    None       Jaynie CrumbleKirichenko, Marcy Sookdeo, PA-C 06/05/17 1220    Mancel BaleWentz,  Elliott, MD 06/05/17 [phone]

## 2017-06-05 NOTE — Discharge Instructions (Signed)
Continue your medications. Follow up with primary care doctor or [facility] or Behavioral health

## 2017-09-16 ENCOUNTER — Emergency Department (HOSPITAL_BASED_OUTPATIENT_CLINIC_OR_DEPARTMENT_OTHER): Payer: Medicaid Other

## 2017-09-16 ENCOUNTER — Other Ambulatory Visit: Payer: Self-pay

## 2017-09-16 ENCOUNTER — Encounter (HOSPITAL_BASED_OUTPATIENT_CLINIC_OR_DEPARTMENT_OTHER): Payer: Self-pay

## 2017-09-16 ENCOUNTER — Emergency Department (HOSPITAL_BASED_OUTPATIENT_CLINIC_OR_DEPARTMENT_OTHER)
Admission: EM | Admit: 2017-09-16 | Discharge: 2017-09-16 | Disposition: A | Discharge status: Home / Self Care | Payer: Medicaid Other | Attending: Emergency Medicine | Admitting: Emergency Medicine

## 2017-09-16 DIAGNOSIS — R300 Dysuria: Secondary | ICD-10-CM

## 2017-09-16 DIAGNOSIS — I10 Essential (primary) hypertension: Secondary | ICD-10-CM | POA: Diagnosis not present

## 2017-09-16 DIAGNOSIS — R109 Unspecified abdominal pain: Secondary | ICD-10-CM

## 2017-09-16 DIAGNOSIS — E119 Type 2 diabetes mellitus without complications: Secondary | ICD-10-CM | POA: Diagnosis not present

## 2017-09-16 DIAGNOSIS — F17210 Nicotine dependence, cigarettes, uncomplicated: Secondary | ICD-10-CM | POA: Insufficient documentation

## 2017-09-16 DIAGNOSIS — Z79899 Other long term (current) drug therapy: Secondary | ICD-10-CM | POA: Insufficient documentation

## 2017-09-16 DIAGNOSIS — R1084 Generalized abdominal pain: Secondary | ICD-10-CM | POA: Insufficient documentation

## 2017-09-16 LAB — CBC WITH DIFFERENTIAL/PLATELET
BASOS ABS: 0.1 10*3/uL (ref 0.0–0.1)
Basophils Relative: 1 %
Eosinophils Absolute: 0.2 10*3/uL (ref 0.0–0.7)
Eosinophils Relative: 2 %
HEMATOCRIT: 40.3 % (ref 36.0–46.0)
Hemoglobin: 14.5 g/dL (ref 12.0–15.0)
LYMPHS ABS: 2.9 10*3/uL (ref 0.7–4.0)
LYMPHS PCT: 34 %
MCH: 34 pg (ref 26.0–34.0)
MCHC: 36 g/dL (ref 30.0–36.0)
MCV: 94.6 fL (ref 78.0–100.0)
Monocytes Absolute: 0.6 10*3/uL (ref 0.1–1.0)
Monocytes Relative: 7 %
NEUTROS ABS: 4.7 10*3/uL (ref 1.7–7.7)
Neutrophils Relative %: 56 %
Platelets: 306 10*3/uL (ref 150–400)
RBC: 4.26 MIL/uL (ref 3.87–5.11)
RDW: 12.8 % (ref 11.5–15.5)
WBC: 8.3 10*3/uL (ref 4.0–10.5)

## 2017-09-16 LAB — WET PREP, GENITAL
SPERM: NONE SEEN
Trich, Wet Prep: NONE SEEN
Yeast Wet Prep HPF POC: NONE SEEN

## 2017-09-16 LAB — COMPREHENSIVE METABOLIC PANEL
ALBUMIN: 4.7 g/dL (ref 3.5–5.0)
ALT: 20 U/L (ref 14–54)
ANION GAP: 10 (ref 5–15)
AST: 26 U/L (ref 15–41)
Alkaline Phosphatase: 65 U/L (ref 38–126)
BILIRUBIN TOTAL: 0.8 mg/dL (ref 0.3–1.2)
BUN: 12 mg/dL (ref 6–20)
CHLORIDE: 106 mmol/L (ref 101–111)
CO2: 18 mmol/L — ABNORMAL LOW (ref 22–32)
Calcium: 9.6 mg/dL (ref 8.9–10.3)
Creatinine, Ser: 0.78 mg/dL (ref 0.44–1.00)
GFR calc Af Amer: >60 mL/min (ref >60)
GFR calc non Af Amer: >60 mL/min (ref >60)
GLUCOSE: 124 mg/dL — AB (ref 65–99)
POTASSIUM: 3.9 mmol/L (ref 3.5–5.1)
Sodium: 134 mmol/L — ABNORMAL LOW (ref 135–145)
TOTAL PROTEIN: 7.9 g/dL (ref 6.5–8.1)

## 2017-09-16 LAB — URINALYSIS, MICROSCOPIC (REFLEX)

## 2017-09-16 LAB — URINALYSIS, ROUTINE W REFLEX MICROSCOPIC
Glucose, UA: NEGATIVE mg/dL
Ketones, ur: 15 mg/dL — AB
Leukocytes, UA: NEGATIVE
Nitrite: NEGATIVE
Protein, ur: 30 mg/dL — AB
Specific Gravity, Urine: 1.025 (ref 1.005–1.030)
pH: 6 (ref 5.0–8.0)

## 2017-09-16 LAB — PREGNANCY, URINE: Preg Test, Ur: NEGATIVE

## 2017-09-16 MED ORDER — SODIUM CHLORIDE 0.9 % IV BOLUS (SEPSIS)
1000.0000 mL | Freq: Once | INTRAVENOUS | Status: AC
  Administered 2017-09-16: 1000 mL via INTRAVENOUS

## 2017-09-16 MED ORDER — METRONIDAZOLE 500 MG PO TABS: 500.0000 mg | ORAL_TABLET | Freq: Two times a day (BID) | ORAL | 0 refills | Status: DC

## 2017-09-16 MED ORDER — SULFAMETHOXAZOLE-TRIMETHOPRIM 800-160 MG PO TABS: 1.0000 | ORAL_TABLET | Freq: Two times a day (BID) | ORAL | 0 refills | Status: AC

## 2017-09-16 MED ORDER — KETOROLAC TROMETHAMINE 30 MG/ML IJ SOLN
30.0000 mg | Freq: Once | INTRAMUSCULAR | Status: AC
  Administered 2017-09-16: 30 mg via INTRAVENOUS
  Filled 2017-09-16: qty 1

## 2017-09-16 MED ORDER — ACETAMINOPHEN 325 MG PO TABS
650.0000 mg | ORAL_TABLET | Freq: Once | ORAL | Status: AC
Start: 2017-09-16 — End: 2017-09-16
  Administered 2017-09-16: 650 mg via ORAL
  Filled 2017-09-16: qty 2

## 2017-09-16 MED FILL — metroNIDAZOLE 500 MG TABS: 500 | 7 days supply | Qty: 14 | Fill #0

## 2017-09-16 MED FILL — SULFAMETHOXAZOLE-TMP DS TAB: 800-160 | 5 days supply | Qty: 10 | Fill #0

## 2017-09-16 NOTE — ED Triage Notes (Signed)
C/o right flank pain x 4 days-states she had US last year and was told she had multiple kidney stone-also c/o vaginal bleeding x 5 weeks-NAD-slow gait

## 2017-09-16 NOTE — Discharge Instructions (Signed)
It was my pleasure taking care of you today!   Fortunately, your ultrasound and CT scan today were reassuring.   Please take all of your antibiotics until finished!   Follow up with your primary care doctor as well as the OBGYN. Please call the women's clinic listed today or tomorrow to schedule a follow up appointment.   You were tested for gonorrhea and chlamydia today. You will be notified in the next 3-4 days if your results are positive.   Return to ER for new or worsening symptoms, any additional concerns.

## 2017-09-16 NOTE — ED Notes (Signed)
Provider at bedside discussing results with patient.

## 2017-09-16 NOTE — ED Provider Notes (Signed)
[facility] EMERGENCY DEPARTMENT Provider Note   CSN: [id number] Arrival date & time: 09/16/17  1054     History   Chief Complaint Chief Complaint  Patient presents with  . Flank Pain    HPI Amanda Valentine is a 42 y.o. female.  The history is provided by the patient and medical records. No language interpreter was used.  Flank Pain  Associated symptoms include abdominal pain.    Amanda Valentine is a 42 y.o. female  with a PMH of prior kidney stones, DM, HTN, bipolar disorder who presents to the Emergency Department with 2 complaints:  1.  Right back and flank pain x 4 days.  Pain is worse with urination.  Described as sharp.  She reports history of kidney stones last summer which felt similar.  No nausea, vomiting, fever or chills.  Due to pain with urination, patient reports very little p.o. intake.  She states that she does not want to drink fluids, because she does not want to urinate due to the pain.  2.  Vaginal bleeding for the last 4 weeks.  Patient reports not having a period for almost a year, then last month began having bleeding which is been persistent.  She also notes an odor to this.  She denies any discharge, but does state that the color will change from light to dark.  She has not been sexually active for several months.  Denies lightheadedness or syncopal episodes.  No fatigue or weakness.  No medications taken prior to arrival for symptoms.  Patient states that she recently weaned off of Suboxone and does not want any narcotic medications while in the hospital or as an outpatient.   Past Medical History:  Diagnosis Date  . Anxiety   . Bipolar 1 disorder (HCC)   . Chronic bronchitis (HCC)   . Depression   . Diabetes mellitus without complication (HCC)    diet controlled  . Epilepsy (HCC)   . Hypertension   . Kidney calculi     Patient Active Problem List   Diagnosis Date Noted  . Seizure (HCC) 11/27/2015  . Bipolar disorder (HCC) 11/27/2015  .  Diabetes mellitus type 2 in nonobese (HCC) 11/27/2015  . GI bleed 11/27/2015  . Seizures (HCC) 11/27/2015  . Acute GI bleeding 11/27/2015    Past Surgical History:  Procedure Laterality Date  . TUBAL LIGATION      OB History    No data available       Home Medications    Prior to Admission medications   Medication Sig Start Date End Date Taking? Authorizing Provider  ALPRAZolam Prudy Feeler) 0.5 MG tablet Take 1 tablet (0.5 mg total) by mouth 3 (three) times daily as needed for anxiety. 06/05/17   Kirichenko, Lemont Fillers, PA-C  ALPRAZolam (XANAX) 1 MG tablet Take 1 mg by mouth 3 (three) times daily as needed. For anxiety    [provider]  buprenorphine-naloxone (SUBOXONE) 8-2 MG SUBL SL tablet Place 1 tablet under the tongue daily.    [provider]  fexofenadine (ALLEGRA) 60 MG tablet Take 1 tablet (60 mg total) by mouth 2 (two) times daily. 02/14/17   Maczis, Elmer Sow, PA-C  ibuprofen (ADVIL,MOTRIN) 800 MG tablet Take 1 tablet (800 mg total) by mouth every 8 (eight) hours as needed. 06/24/16   Cuthriell, Delorise Royals, PA-C  levETIRAcetam (KEPPRA) 500 MG tablet Take 1 tablet (500 mg total) by mouth 2 (two) times daily. 11/27/15   Rhetta Mura, MD  levETIRAcetam (KEPPRA) 500 MG tablet Take 1 tablet (500 mg total) by mouth 2 (two) times daily. 06/05/17   Kirichenko, Tatyana, PA-C  metroNIDAZOLE (FLAGYL) 500 MG tablet Take 1 tablet (500 mg total) by mouth 2 (two) times daily. 09/16/17   Spenser Cong, Chase Picket, PA-C  QUEtiapine Fumarate (SEROQUEL XR) 150 MG 24 hr tablet Take 75 mg by mouth at bedtime.    [provider]  sulfamethoxazole-trimethoprim (BACTRIM DS,SEPTRA DS) 800-160 MG tablet Take 1 tablet by mouth 2 (two) times daily for 5 days. 09/16/17 09/21/17  Naquisha Whitehair, Chase Picket, PA-C  tiotropium (SPIRIVA) 18 MCG inhalation capsule Place 18 mcg into inhaler and inhale daily.    [provider]    Family History Family History  Problem Relation Age of  Onset  . Seizures Maternal Grandmother   . Diabetes Mellitus II Maternal Grandfather   . Hypertension Other   . CAD Other   . Cancer Other   . Kidney failure Other     Social History Social History   Tobacco Use  . Smoking status: Current Every Day Smoker    Packs/day: 0.50    Types: Cigarettes  . Smokeless tobacco: Never Used  Substance Use Topics  . Alcohol use: Yes    Comment: occ  . Drug use: Yes    Types: Marijuana     Allergies   Acyclovir and related; Depakote [valproic acid]; Penicillins; and Orange juice [orange oil]   Review of Systems Review of Systems  Gastrointestinal: Positive for abdominal pain. Negative for constipation, diarrhea, nausea and vomiting.  Genitourinary: Positive for difficulty urinating, dysuria, flank pain and vaginal bleeding. Negative for vaginal discharge.  All other systems reviewed and are negative.    Physical Exam Updated Vital Signs BP 115/80 (BP Location: Left Arm)   Pulse 73   Temp 97.8 F (36.6 C) (Oral)   Resp 18   Ht 5' (1.524 m)   Wt 68.4 kg (150 lb 12.7 oz)   SpO2 97%   BMI 29.45 kg/m   Physical Exam  Constitutional: She is oriented to person, place, and time. She appears well-developed and well-nourished. No distress.  HENT:  Head: Normocephalic and atraumatic.  Cardiovascular: Normal rate, regular rhythm and normal heart sounds.  No murmur heard. Pulmonary/Chest: Effort normal and breath sounds normal. No respiratory distress.  Abdominal: Soft. She exhibits no distension.  Right CVA and flank tenderness. No abdominal tenderness.  Genitourinary:  Genitourinary Comments: Chaperone present for exam. + Bleeding.  No discharge appreciated, but exam limited due to menstrual bleeding.+ right adnexal tenderness. No CMT.   Neurological: She is alert and oriented to person, place, and time.  Skin: Skin is warm and dry.  Nursing note and vitals reviewed.    ED Treatments / Results  Labs (all labs ordered are  listed, but only abnormal results are displayed) Labs Reviewed  WET PREP, GENITAL - Abnormal; Notable for the following components:      Result Value   Clue Cells Wet Prep HPF POC PRESENT (*)    WBC, Wet Prep HPF POC MODERATE (*)    All other components within normal limits  URINALYSIS, ROUTINE W REFLEX MICROSCOPIC - Abnormal; Notable for the following components:   Color, Urine ORANGE (*)    APPearance HAZY (*)    Hgb urine dipstick LARGE (*)    Bilirubin Urine MODERATE (*)    Ketones, ur 15 (*)    Protein, ur 30 (*)    All other components within normal limits  COMPREHENSIVE METABOLIC PANEL - Abnormal; Notable for the following components:   Sodium 134 (*)    CO2 18 (*)    Glucose, Bld 124 (*)    All other components within normal limits  URINALYSIS, MICROSCOPIC (REFLEX) - Abnormal; Notable for the following components:   Bacteria, UA MANY (*)    Squamous Epithelial / LPF 6-30 (*)    All other components within normal limits  URINE CULTURE  PREGNANCY, URINE  CBC WITH DIFFERENTIAL/PLATELET  GC/CHLAMYDIA PROBE AMP ([place]) NOT AT [facility]    EKG  EKG Interpretation None       Radiology US Transvaginal Non-ob  Result Date: 09/16/2017 CLINICAL DATA:  Pelvic pain and vaginal bleeding for 1 month. EXAM: TRANSABDOMINAL AND TRANSVAGINAL ULTRASOUND OF PELVIS TECHNIQUE: Both transabdominal and transvaginal ultrasound examinations of the pelvis were performed. Transabdominal technique was performed for global imaging of the pelvis including uterus, ovaries, adnexal regions, and pelvic cul-de-sac. It was necessary to proceed with endovaginal exam following the transabdominal exam to visualize the endometrium and ovaries. COMPARISON:  Ultrasound of September 01, 2006. FINDINGS: Uterus Measurements: 7.6 x 4.1 x 3.9 cm. No fibroids or other mass visualized. Endometrium Thickness: 3.8 mm which is within normal limits. No focal abnormality visualized. Right ovary Not visualized due to  overlying bowel. Left ovary Measurements: 2.6 x 2.4 x 2.1 cm. Normal appearance/no adnexal mass. Other findings No abnormal free fluid. IMPRESSION: Right ovary is not visualized due to overlying bowel. No other abnormality seen in the pelvis. Electronically Signed   By: Lupita Raider, M.D.   On: 09/16/2017 14:59   US Pelvis Complete  Result Date: 09/16/2017 CLINICAL DATA:  Pelvic pain and vaginal bleeding for 1 month. EXAM: TRANSABDOMINAL AND TRANSVAGINAL ULTRASOUND OF PELVIS TECHNIQUE: Both transabdominal and transvaginal ultrasound examinations of the pelvis were performed. Transabdominal technique was performed for global imaging of the pelvis including uterus, ovaries, adnexal regions, and pelvic cul-de-sac. It was necessary to proceed with endovaginal exam following the transabdominal exam to visualize the endometrium and ovaries. COMPARISON:  Ultrasound of September 01, 2006. FINDINGS: Uterus Measurements: 7.6 x 4.1 x 3.9 cm. No fibroids or other mass visualized. Endometrium Thickness: 3.8 mm which is within normal limits. No focal abnormality visualized. Right ovary Not visualized due to overlying bowel. Left ovary Measurements: 2.6 x 2.4 x 2.1 cm. Normal appearance/no adnexal mass. Other findings No abnormal free fluid. IMPRESSION: Right ovary is not visualized due to overlying bowel. No other abnormality seen in the pelvis. Electronically Signed   By: Lupita Raider, M.D.   On: 09/16/2017 14:59   [place] Art/ven Flow Abd Pelv Doppler  Result Date: 09/16/2017 CLINICAL DATA:  Pelvic pain and vaginal bleeding for 1 month. EXAM: TRANSABDOMINAL AND TRANSVAGINAL ULTRASOUND OF PELVIS TECHNIQUE: Both transabdominal and transvaginal ultrasound examinations of the pelvis were performed. Transabdominal technique was performed for global imaging of the pelvis including uterus, ovaries, adnexal regions, and pelvic cul-de-sac. It was necessary to proceed with endovaginal exam following the transabdominal exam to  visualize the endometrium and ovaries. COMPARISON:  Ultrasound of September 01, 2006. FINDINGS: Uterus Measurements: 7.6 x 4.1 x 3.9 cm. No fibroids or other mass visualized. Endometrium Thickness: 3.8 mm which is within normal limits. No focal abnormality visualized. Right ovary Not visualized due to overlying bowel. Left ovary Measurements: 2.6 x 2.4 x 2.1 cm. Normal appearance/no adnexal mass. Doppler demonstrates normal arterial and venous waveforms in left ovary. Other findings No abnormal free fluid. IMPRESSION: Right ovary is not visualized  due to overlying bowel. No other abnormality seen in the pelvis. Electronically Signed   By: Lupita Raider, M.D.   On: 09/16/2017 14:59   Ct Renal Stone Study  Result Date: 09/16/2017 CLINICAL DATA:  Right flank pain and right abdominal pain for months. History kidney stones. Diabetes. EXAM: CT ABDOMEN AND PELVIS WITHOUT CONTRAST TECHNIQUE: Multidetector CT imaging of the abdomen and pelvis was performed following the standard protocol without IV contrast. COMPARISON:  04/30/2010 CT. FINDINGS: Lower chest: Clear lung bases. Normal heart size without pericardial or pleural effusion. Hepatobiliary: Normal noncontrast appearance the liver and gallbladder, without biliary duct dilatation. Pancreas: Normal, without mass or ductal dilatation. Spleen: Curvilinear calcification in the spleen measures approximately 7 mm and is of doubtful clinical significance. Adrenals/Urinary Tract: Normal adrenal glands. No renal calculi or hydronephrosis. Multiple phleboliths, but no hydroureter or convincing evidence of ureteric stone. No bladder calculi. Stomach/Bowel: Normal stomach, without wall thickening. Normal colon, appendix, and terminal ileum. Normal small bowel. Vascular/Lymphatic: Normal caliber of the aorta and branch vessels. No abdominopelvic adenopathy. Reproductive: Normal uterus and adnexa. Other: No significant free fluid. Musculoskeletal: Degenerate disc disease at the  lumbosacral junction. IMPRESSION: 1.  No urinary tract calculi or hydronephrosis. 2. No other explanation for right flank pain. Electronically Signed   By: Jeronimo Greaves M.D.   On: 09/16/2017 13:28    Procedures Procedures (including critical care time)  Medications Ordered in ED Medications  sodium chloride 0.9 % bolus 1,000 mL (0 mLs Intravenous Stopped 09/16/17 1446)  ketorolac (TORADOL) 30 MG/ML injection 30 mg (30 mg Intravenous Given 09/16/17 1247)  sodium chloride 0.9 % bolus 1,000 mL (1,000 mLs Intravenous New Bag/Given 09/16/17 1509)     Initial Impression / Assessment and Plan / ED Course  I have reviewed the triage vital signs and the nursing notes.  Pertinent labs & imaging results that were available during my care of the patient were reviewed by me and considered in my medical decision making (see chart for details).    Amanda Valentine is a 42 y.o. female who presents to ED for right back and flank pain which she feels is similar to previous kidney stone as well as vaginal bleeding for the last 4-5 weeks.  On exam, patient is afebrile, hemodynamically stable with no abdominal tenderness.  She does have right CVA and flank tenderness.  Pelvic exam with right adnexal tenderness.  No discharge appreciated, but exam limited by menstrual bleeding.  Blood work reviewed: hgb stable. CT renal study negative. Pelvic ultrasound reassuring. Right ovary not visualized due to overlying bowel. Torsion cannot be completely excluded given imaging, but would be highly unlikely given history of sxs for several days with similar intermittent episodes over the last several months. It is also reassuring that adnexa was normal on CT. UA with negative nitrites and leuks, 6-30 squamous, 0-5 wbc's but does have many bacteria. Sent for cx. Given dysuria, will treat. G&C pending and patient aware that she will be notified if results are positive. Adequately hydrated in ED. Tolerating PO. Follow up care (OBGYN and PCP)  and return precautions discussed. All questions answered.   Patient discussed with Dr. Juleen China who agrees with treatment plan.   Final Clinical Impressions(s) / ED Diagnoses   Final diagnoses:  Right flank pain  Dysuria    ED Discharge Orders        Ordered    metroNIDAZOLE (FLAGYL) 500 MG tablet  2 times daily     09/16/17 1514  sulfamethoxazole-trimethoprim (BACTRIM DS,SEPTRA DS) 800-160 MG tablet  2 times daily     09/16/17 1514       Araina Butrick, Chase PicketJaime Pilcher, PA-C 09/16/17 1524    Raeford RazorKohut, Stephen, MD 09/17/17 [phone]

## 2017-09-17 LAB — URINE CULTURE: CULTURE: NO GROWTH

## 2017-09-17 LAB — GC/CHLAMYDIA PROBE AMP (~~LOC~~) NOT AT ARMC
Chlamydia: NEGATIVE
Neisseria Gonorrhea: NEGATIVE

## 2017-09-28 ENCOUNTER — Telehealth: Payer: Self-pay | Admitting: Obstetrics and Gynecology

## 2017-12-10 ENCOUNTER — Inpatient Hospital Stay (HOSPITAL_COMMUNITY)
Admission: AD | Admit: 2017-12-10 | Discharge: 2017-12-10 | Disposition: A | Discharge status: Home / Self Care | Payer: Medicaid Other | Source: Ambulatory Visit | Attending: Obstetrics and Gynecology | Admitting: Obstetrics and Gynecology

## 2017-12-10 ENCOUNTER — Encounter (HOSPITAL_COMMUNITY): Payer: Self-pay

## 2017-12-10 DIAGNOSIS — N3000 Acute cystitis without hematuria: Secondary | ICD-10-CM | POA: Insufficient documentation

## 2017-12-10 DIAGNOSIS — Z888 Allergy status to other drugs, medicaments and biological substances status: Secondary | ICD-10-CM | POA: Insufficient documentation

## 2017-12-10 DIAGNOSIS — F17210 Nicotine dependence, cigarettes, uncomplicated: Secondary | ICD-10-CM | POA: Diagnosis not present

## 2017-12-10 DIAGNOSIS — Z79899 Other long term (current) drug therapy: Secondary | ICD-10-CM | POA: Diagnosis not present

## 2017-12-10 DIAGNOSIS — G40909 Epilepsy, unspecified, not intractable, without status epilepticus: Secondary | ICD-10-CM | POA: Diagnosis not present

## 2017-12-10 DIAGNOSIS — N939 Abnormal uterine and vaginal bleeding, unspecified: Secondary | ICD-10-CM | POA: Insufficient documentation

## 2017-12-10 DIAGNOSIS — F319 Bipolar disorder, unspecified: Secondary | ICD-10-CM | POA: Diagnosis not present

## 2017-12-10 DIAGNOSIS — E119 Type 2 diabetes mellitus without complications: Secondary | ICD-10-CM | POA: Diagnosis not present

## 2017-12-10 DIAGNOSIS — I10 Essential (primary) hypertension: Secondary | ICD-10-CM | POA: Insufficient documentation

## 2017-12-10 DIAGNOSIS — Z880 Allergy status to penicillin: Secondary | ICD-10-CM | POA: Insufficient documentation

## 2017-12-10 DIAGNOSIS — R109 Unspecified abdominal pain: Secondary | ICD-10-CM | POA: Diagnosis present

## 2017-12-10 LAB — URINALYSIS, ROUTINE W REFLEX MICROSCOPIC
GLUCOSE, UA: NEGATIVE mg/dL
KETONES UR: NEGATIVE mg/dL
Nitrite: POSITIVE — AB
PH: 6 (ref 5.0–8.0)
Protein, ur: NEGATIVE mg/dL
Specific Gravity, Urine: 1.023 (ref 1.005–1.030)
WBC, UA: >50 WBC/hpf — ABNORMAL HIGH (ref 0–5)

## 2017-12-10 LAB — WET PREP, GENITAL
CLUE CELLS WET PREP: NONE SEEN
Sperm: NONE SEEN
Trich, Wet Prep: NONE SEEN
YEAST WET PREP: NONE SEEN

## 2017-12-10 LAB — POCT PREGNANCY, URINE: Preg Test, Ur: NEGATIVE

## 2017-12-10 LAB — CBC WITH DIFFERENTIAL/PLATELET
Basophils Absolute: 0 10*3/uL (ref 0.0–0.1)
Basophils Relative: 0 %
Eosinophils Absolute: 0.3 10*3/uL (ref 0.0–0.7)
Eosinophils Relative: 5 %
HEMATOCRIT: 40.3 % (ref 36.0–46.0)
HEMOGLOBIN: 13.6 g/dL (ref 12.0–15.0)
LYMPHS ABS: 1.8 10*3/uL (ref 0.7–4.0)
LYMPHS PCT: 37 %
MCH: 33.3 pg (ref 26.0–34.0)
MCHC: 33.7 g/dL (ref 30.0–36.0)
MCV: 98.5 fL (ref 78.0–100.0)
MONOS PCT: 5 %
Monocytes Absolute: 0.2 10*3/uL (ref 0.1–1.0)
NEUTROS ABS: 2.6 10*3/uL (ref 1.7–7.7)
NEUTROS PCT: 53 %
Platelets: 295 10*3/uL (ref 150–400)
RBC: 4.09 MIL/uL (ref 3.87–5.11)
RDW: 12.9 % (ref 11.5–15.5)
WBC: 4.9 10*3/uL (ref 4.0–10.5)

## 2017-12-10 LAB — TYPE AND SCREEN
ABO/RH(D): O NEG
Antibody Screen: NEGATIVE

## 2017-12-10 MED ORDER — SULFAMETHOXAZOLE-TRIMETHOPRIM 800-160 MG PO TABS
1.0000 | ORAL_TABLET | Freq: Once | ORAL | Status: AC
  Administered 2017-12-10: 1 via ORAL
  Filled 2017-12-10: qty 1

## 2017-12-10 MED ORDER — SULFAMETHOXAZOLE-TRIMETHOPRIM 800-160 MG PO TABS: 1.0000 | ORAL_TABLET | Freq: Two times a day (BID) | ORAL | 1 refills | Status: DC

## 2017-12-10 MED ORDER — PHENAZOPYRIDINE HCL 100 MG PO TABS
200.0000 mg | ORAL_TABLET | Freq: Once | ORAL | Status: AC
Start: 2017-12-10 — End: 2017-12-10
  Administered 2017-12-10: 200 mg via ORAL
  Filled 2017-12-10: qty 2

## 2017-12-10 MED ORDER — MEGESTROL ACETATE 40 MG PO TABS
80.0000 mg | ORAL_TABLET | Freq: Once | ORAL | Status: AC
  Administered 2017-12-10: 80 mg via ORAL
  Filled 2017-12-10: qty 2

## 2017-12-10 MED ORDER — PHENAZOPYRIDINE HCL 200 MG PO TABS: 200.0000 mg | ORAL_TABLET | Freq: Three times a day (TID) | ORAL | 0 refills | Status: DC | PRN

## 2017-12-10 MED ORDER — KETOROLAC TROMETHAMINE 10 MG PO TABS: 10.0000 mg | ORAL_TABLET | Freq: Four times a day (QID) | ORAL | 0 refills | Status: DC | PRN

## 2017-12-10 MED ORDER — KETOROLAC TROMETHAMINE 60 MG/2ML IM SOLN
60.0000 mg | Freq: Once | INTRAMUSCULAR | Status: AC
  Administered 2017-12-10: 60 mg via INTRAMUSCULAR
  Filled 2017-12-10: qty 2

## 2017-12-10 MED ORDER — MEGESTROL ACETATE 40 MG PO TABS: 40.0000 mg | ORAL_TABLET | ORAL | 3 refills | Status: AC

## 2017-12-10 NOTE — Discharge Instructions (Signed)
Abnormal Uterine Bleeding Abnormal uterine bleeding can affect women at various stages in life, including teenagers, women in their reproductive years, pregnant women, and women who have reached menopause. Several kinds of uterine bleeding are considered abnormal, including:  Bleeding or spotting between periods.  Bleeding after sexual intercourse.  Bleeding that is heavier or more than normal.  Periods that last longer than usual.  Bleeding after menopause.  Many cases of abnormal uterine bleeding are minor and simple to treat, while others are more serious. Any type of abnormal bleeding should be evaluated by your health care provider. Treatment will depend on the cause of the bleeding. Follow these instructions at home: Monitor your condition for any changes. The following actions may help to alleviate any discomfort you are experiencing:  Avoid the use of tampons and douches as directed by your health care provider.  Change your pads frequently.  You should get regular pelvic exams and Pap tests. Keep all follow-up appointments for diagnostic tests as directed by your health care provider. Contact a health care provider if:  Your bleeding lasts more than 1 week.  You feel dizzy at times. Get help right away if:  You pass out.  You are changing pads every 15 to 30 minutes.  You have abdominal pain.  You have a fever.  You become sweaty or weak.  You are passing large blood clots from the vagina.  You start to feel nauseous and vomit. This information is not intended to replace advice given to you by your health care provider. Make sure you discuss any questions you have with your health care provider. Document Released: 06/30/2005 Document Revised: 12/12/2015 Document Reviewed: 01/27/2013 Elsevier Interactive Patient Education  2017 Elsevier [employer]   Urinary Tract Infection, Adult A urinary tract infection (UTI) is an infection of any part of the urinary tract, which  includes the kidneys, ureters, bladder, and urethra. These organs make, store, and get rid of urine in the body. UTI can be a bladder infection (cystitis) or kidney infection (pyelonephritis). What are the causes? This infection may be caused by fungi, viruses, or bacteria. Bacteria are the most common cause of UTIs. This condition can also be caused by repeated incomplete emptying of the bladder during urination. What increases the risk? This condition is more likely to develop if:  You ignore your need to urinate or hold urine for long periods of time.  You do not empty your bladder completely during urination.  You wipe back to front after urinating or having a bowel movement, if you are female.  You are uncircumcised, if you are female.  You are constipated.  You have a urinary catheter that stays in place (indwelling).  You have a weak defense (immune) system.  You have a medical condition that affects your bowels, kidneys, or bladder.  You have diabetes.  You take antibiotic medicines frequently or for long periods of time, and the antibiotics no longer work well against certain types of infections (antibiotic resistance).  You take medicines that irritate your urinary tract.  You are exposed to chemicals that irritate your urinary tract.  You are female.  What are the signs or symptoms? Symptoms of this condition include:  Fever.  Frequent urination or passing small amounts of urine frequently.  Needing to urinate urgently.  Pain or burning with urination.  Urine that smells bad or unusual.  Cloudy urine.  Pain in the lower abdomen or back.  Trouble urinating.  Blood in the urine.  Vomiting  or being less hungry than normal.  Diarrhea or abdominal pain.  Vaginal discharge, if you are female.  How is this diagnosed? This condition is diagnosed with a medical history and physical exam. You will also need to provide a urine sample to test your urine. Other  tests may be done, including:  Blood tests.  Sexually transmitted disease (STD) testing.  If you have had more than one UTI, a cystoscopy or imaging studies may be done to determine the cause of the infections. How is this treated? Treatment for this condition often includes a combination of two or more of the following:  Antibiotic medicine.  Other medicines to treat less common causes of UTI.  Over-the-counter medicines to treat pain.  Drinking enough water to stay hydrated.  Follow these instructions at home:  Take over-the-counter and prescription medicines only as told by your health care provider.  If you were prescribed an antibiotic, take it as told by your health care provider. Do not stop taking the antibiotic even if you start to feel better.  Avoid alcohol, caffeine, tea, and carbonated beverages. They can irritate your bladder.  Drink enough fluid to keep your urine clear or pale yellow.  Keep all follow-up visits as told by your health care provider. This is important.  Make sure to: ? Empty your bladder often and completely. Do not hold urine for long periods of time. ? Empty your bladder before and after sex. ? Wipe from front to back after a bowel movement if you are female. Use each tissue one time when you wipe. Contact a health care provider if:  You have back pain.  You have a fever.  You feel nauseous or vomit.  Your symptoms do not get better after 3 days.  Your symptoms go away and then return. Get help right away if:  You have severe back pain or lower abdominal pain.  You are vomiting and cannot keep down any medicines or water. This information is not intended to replace advice given to you by your health care provider. Make sure you discuss any questions you have with your health care provider. Document Released: 04/09/2005 Document Revised: 12/12/2015 Document Reviewed: 05/21/2015 Elsevier Interactive Patient Education  [employer].

## 2017-12-10 NOTE — MAU Note (Addendum)
Pt gave me a list of her symptoms as follows:  Pain daily A LOT of weight gain Havent had a period in over 6 months and started on 12/07/17 Heavy bleeding through a super plus tampon in 1-2 hours Pain during and after sex Throwing up for days at a time and bedridden No appetite but have weight gain No energy Can't even be a part of my kids fun bc of being sick so much, feeling sleepy all the time.

## 2017-12-10 NOTE — MAU Provider Note (Signed)
Chief Complaint: Abdominal Pain; Vaginal Bleeding; Metrorrhagia; Nausea; and Emesis   First Provider Initiated Contact with Patient 12/10/17 1055      SUBJECTIVE HPI: Amanda Valentine is a 42 y.o. [phone] female who presents to Maternity Admissions reporting the following:  Pain daily A LOT of weight gain Havent had a period in over 6 months and started on 12/07/17 Heavy bleeding through a super plus tampon in 1-2 hours Pain during and after sex Throwing up for days at a time and bedridden No appetite but have weight gain No energy Can't even be a part of my kids fun bc of being sick so much, feeling sleepy all the time.  Has new PCP, but hasn't been yet. Doesn't have a Gyn and hasn't had a Pap in about 4 years. States she has a Hx of abnormal Paps. Has HX irreg cycles and heavy bleeding. Adamantly requesting a Hysterectomy.   Location: pelvis/suprapubic Quality: cramping Severity: 20/10 on pain scale Duration: Few days, but usually has bad cramps w/ menses Context: w/ menses Timing: intermittent Modifying factors: None. Hasn't tried anything for the pain.  Associated signs and symptoms: Neg for fever, chills, vaginal discharge, hematuria. Pos for urinary frequency, urgency, VB, passing clots, occasional N/V independent of menses.   Past Medical History:  Diagnosis Date  . Anxiety   . Bipolar 1 disorder (HCC)   . Chronic bronchitis (HCC)   . Depression   . Diabetes mellitus without complication (HCC)    diet controlled  . Epilepsy (HCC)   . Hypertension   . Kidney calculi   Questionable Hx Gall stones  OB History  Gravida Para Term Preterm AB Living  6 4 4   2 4   SAB TAB Ectopic Multiple Live Births  1   1   4     # Outcome Date GA Lbr Len/2nd Weight Sex Delivery Anes PTL Lv  6 Term      Vag-Spont   LIV  5 Term      Vag-Spont   LIV  4 Term      Vag-Spont   LIV  3 Term      Vag-Spont   LIV  2 Ectopic           1 SAB            Past Surgical History:  Procedure  Laterality Date  . TUBAL LIGATION     Social History   Socioeconomic History  . Marital status: Married    Spouse name: Not on file  . Number of children: Not on file  . Years of education: Not on file  . Highest education level: Not on file  Occupational History  . Not on file  Social Needs  . Financial resource strain: Not on file  . Food insecurity:    Worry: Not on file    Inability: Not on file  . Transportation needs:    Medical: Not on file    Non-medical: Not on file  Tobacco Use  . Smoking status: Current Every Day Smoker    Packs/day: 1.00    Types: Cigarettes  . Smokeless tobacco: Never Used  Substance and Sexual Activity  . Alcohol use: Yes    Comment: occ  . Drug use: Yes    Types: Marijuana  . Sexual activity: Never    Birth control/protection: Surgical  Lifestyle  . Physical activity:    Days per week: Not on file    Minutes per session: Not on file  .  Stress: Not on file  Relationships  . Social connections:    Talks on phone: Not on file    Gets together: Not on file    Attends religious service: Not on file    Active member of club or organization: Not on file    Attends meetings of clubs or organizations: Not on file    Relationship status: Not on file  . Intimate partner violence:    Fear of current or ex partner: Not on file    Emotionally abused: Not on file    Physically abused: Not on file    Forced sexual activity: Not on file  Other Topics Concern  . Not on file  Social History Narrative  . Not on file   Family History  Problem Relation Age of Onset  . Seizures Maternal Grandmother   . Diabetes Mellitus II Maternal Grandfather   . Hypertension Other   . CAD Other   . Cancer Other   . Kidney failure Other    No current facility-administered medications on file prior to encounter.    Current Outpatient Medications on File Prior to Encounter  Medication Sig Dispense Refill  . ALPRAZolam (XANAX) 1 MG tablet Take 1 mg by mouth  3 (three) times daily as needed. For anxiety    . levETIRAcetam (KEPPRA) 500 MG tablet Take 1 tablet (500 mg total) by mouth 2 (two) times daily. 60 tablet 0  . QUEtiapine Fumarate (SEROQUEL XR) 150 MG 24 hr tablet Take 75 mg by mouth at bedtime.    . tiotropium (SPIRIMarland KitchenVA) 18 MCG inhalation capsule Place 18 mcg into inhaler and inhale daily.    Marland Kitchen ALPRAZolam (XANAX) 0.5 MG tablet Take 1 tablet (0.5 mg total) by mouth 3 (three) times daily as needed for anxiety. (Patient not taking: Reported on 12/10/2017) 15 tablet 0  . metroNIDAZOLE (FLAGYL) 500 MG tablet Take 1 tablet (500 mg total) by mouth 2 (two) times daily. (Patient not taking: Reported on 12/10/2017) 14 tablet 0   Allergies  Allergen Reactions  . Acyclovir And Related   . Depakote [Valproic Acid]   . Penicillins Other (See Comments)    Has patient had a PCN reaction causing immediate rash, facial/tongue/throat swelling, SOB or lightheadedness with hypotension: No Has patient had a PCN reaction causing severe rash involving mucus membranes or skin necrosis: No Has patient had a PCN reaction that required hospitalization No Has patient had a PCN reaction occurring within the last 10 years: No If all of the above answers are "NO", then may proceed with Cephalosporin use.  Erskine Emery Juice [Orange Oil] Rash    I have reviewed patient's Past Medical Hx, Surgical Hx, Family Hx, Social Hx, medications and allergies.   Review of Systems  Constitutional: Positive for appetite change (decreased), fatigue and unexpected weight change (wt gain). Negative for chills and fever.  Gastrointestinal: Positive for abdominal pain and nausea. Negative for abdominal distention, blood in stool, constipation, diarrhea and vomiting.  Genitourinary: Positive for dyspareunia, frequency, menstrual problem, pelvic pain and vaginal bleeding. Negative for difficulty urinating, dysuria, flank pain, hematuria, urgency and vaginal discharge.  Musculoskeletal: Negative for  back pain and myalgias.  Neurological: Negative for dizziness.    OBJECTIVE Patient Vitals for the past 24 hrs:  BP Temp Pulse Resp SpO2 Weight  12/10/17 0944 105/71 97.7 F (36.5 C) 82 18 - -  12/10/17 0943 - - - - 96 % -  12/10/17 0937 - - - - - 156 lb 8 oz (  71 kg)   Constitutional: Well-developed, well-nourished female in moderate distress.  Cardiovascular: normal rate Respiratory: normal rate and effort.  GI: Abd soft, non-tender, no rebound tenderness or mass. Pos BS x 4 MS: Extremities nontender, no edema, normal ROM Neurologic: Alert and oriented x 4.  GU: Neg CVAT.  SPECULUM EXAM: NEFG, physiologic discharge, small amount of dark red blood noted, cervix clean  BIMANUAL: cervix closed; uterus normal size, no adnexal tenderness or masses. No CMT.  LAB RESULTS Results for orders placed or performed during the hospital encounter of 12/10/17 (from the past 24 hour(s))  Urinalysis, Routine w reflex microscopic     Status: Abnormal   Collection Time: 12/10/17  9:20 AM  Result Value Ref Range   Color, Urine AMBER (A) YELLOW   APPearance CLEAR CLEAR   Specific Gravity, Urine 1.023 1.005 - 1.030   pH 6.0 5.0 - 8.0   Glucose, UA NEGATIVE NEGATIVE mg/dL   Hgb urine dipstick MODERATE (A) NEGATIVE   Bilirubin Urine SMALL (A) NEGATIVE   Ketones, ur NEGATIVE NEGATIVE mg/dL   Protein, ur NEGATIVE NEGATIVE mg/dL   Nitrite POSITIVE (A) NEGATIVE   Leukocytes, UA TRACE (A) NEGATIVE   RBC / HPF 21-50 0 - 5 RBC/hpf   WBC, UA >50 (H) 0 - 5 WBC/hpf   Bacteria, UA MANY (A) NONE SEEN   Squamous Epithelial / LPF 6-10 0 - 5   Mucus PRESENT   Pregnancy, urine POC     Status: None   Collection Time: 12/10/17  9:38 AM  Result Value Ref Range   Preg Test, Ur NEGATIVE NEGATIVE  CBC with Differential/Platelet     Status: None   Collection Time: 12/10/17 10:34 AM  Result Value Ref Range   WBC 4.9 4.0 - 10.5 K/uL   RBC 4.09 3.87 - 5.11 MIL/uL   Hemoglobin 13.6 12.0 - 15.0 g/dL   HCT 16.1 09.6  - 04.5 %   MCV 98.5 78.0 - 100.0 fL   MCH 33.3 26.0 - 34.0 pg   MCHC 33.7 30.0 - 36.0 g/dL   RDW 40.9 81.1 - 91.4 %   Platelets 295 150 - 400 K/uL   Neutrophils Relative % 53 %   Neutro Abs 2.6 1.7 - 7.7 K/uL   Lymphocytes Relative 37 %   Lymphs Abs 1.8 0.7 - 4.0 K/uL   Monocytes Relative 5 %   Monocytes Absolute 0.2 0.1 - 1.0 K/uL   Eosinophils Relative 5 %   Eosinophils Absolute 0.3 0.0 - 0.7 K/uL   Basophils Relative 0 %   Basophils Absolute 0.0 0.0 - 0.1 K/uL  Wet prep, genital     Status: Abnormal   Collection Time: 12/10/17 11:15 AM  Result Value Ref Range   Yeast Wet Prep HPF POC NONE SEEN NONE SEEN   Trich, Wet Prep NONE SEEN NONE SEEN   Clue Cells Wet Prep HPF POC NONE SEEN NONE SEEN   WBC, Wet Prep HPF POC FEW (A) NONE SEEN   Sperm NONE SEEN     IMAGING No results found.  MAU COURSE Orders Placed This Encounter  Procedures  . Wet prep, genital  . Urinalysis, Routine w reflex microscopic  . CBC with Differential/Platelet  . Pregnancy, urine POC  . Type and screen  . Discharge patient   Meds ordered this encounter  Medications  . ketorolac (TORADOL) injection 60 mg  . sulfamethoxazole-trimethoprim (BACTRIM DS,SEPTRA DS) 800-160 MG per tablet 1 tablet  . phenazopyridine (PYRIDIUM) tablet 200 mg  .  megestrol (MEGACE) tablet 80 mg  . ketorolac (TORADOL) 10 MG tablet    Sig: Take 1 tablet (10 mg total) by mouth every 6 (six) hours as needed for severe pain.    Dispense:  20 tablet    Refill:  0    Order Specific Question:   Supervising Provider    Answer:   Gillette Bing [id number]  . megestrol (MEGACE) 40 MG tablet    Sig: Take 1 tablet (40 mg total) by mouth as directed. Take one tablet three times a day for 3 days, then one tablet twice daily for 3 days, then one tablet daily    Dispense:  90 tablet    Refill:  3    Order Specific Question:   Supervising Provider    Answer:   Ransom Bing [[id number]]  . phenazopyridine (PYRIDIUM) 200 MG tablet     Sig: Take 1 tablet (200 mg total) by mouth 3 (three) times daily as needed for pain.    Dispense:  12 tablet    Refill:  0    Order Specific Question:   Supervising Provider    Answer:   Twining Bing [id number]  . sulfamethoxazole-trimethoprim (BACTRIM DS,SEPTRA DS) 800-160 MG tablet    Sig: Take 1 tablet by mouth 2 (two) times daily.    Dispense:  14 tablet    Refill:  1    Order Specific Question:   Supervising Provider    Answer:    Bing [[id number]]   Pain much better after toradol  MDM - AUB and dysmenorrhea likely due to long interval since last menses. Bleeding, H&H, VS stable. Will Tx AUB w/ Megace, dysmenorrhea w/ Toradol. Needs office visit for possible EBX and to discuss AUB management. Pt strongly desire Hysterectomy. CNM explained that there are other hormonal option and ablation that may be tried first and w/ lower risk. Hysterectomy will not cure all of her complaints.  - UTI w/out evidence of pyelo. Will Tx w/ Bactrim BD and Pyridium.   ASSESSMENT 1. Acute cystitis without hematuria   2. Abnormal uterine bleeding (AUB)     PLAN Discharge home in stable condition. Bleeding and pyelo precautions F/U w/ PCP for primary care and non-gyn concerns. Follow-up Information    [facility] Follow up.   Specialty:  Obstetrics and Gynecology Why:  will call you to schedule gynecology appointment  Contact information: [address] [place] [address] [phone]       [facility] [place] MATERNITY ADMISSIONS Follow up.   Why:  as needed for gynecology emergencies Contact information: [address] [id number] mc [place] [address] [phone]         Allergies as of 12/10/2017      Reactions   Acyclovir And Related    Depakote [valproic Acid]    Penicillins Other (See Comments)   Has patient had a PCN reaction causing immediate rash, facial/tongue/throat swelling, SOB or  lightheadedness with hypotension: No Has patient had a PCN reaction causing severe rash involving mucus membranes or skin necrosis: No Has patient had a PCN reaction that required hospitalization No Has patient had a PCN reaction occurring within the last 10 years: No If all of the above answers are "NO", then may proceed with Cephalosporin use.   Orange Juice [orange Oil] Rash      Medication List    STOP taking these medications   metroNIDAZOLE 500 MG tablet Commonly known as:  FLAGYL  TAKE these medications   ALPRAZolam 1 MG tablet Commonly known as:  XANAX Take 1 mg by mouth 3 (three) times daily as needed. For anxiety What changed:  Another medication with the same name was removed. Continue taking this medication, and follow the directions you see here.   ketorolac 10 MG tablet Commonly known as:  TORADOL Take 1 tablet (10 mg total) by mouth every 6 (six) hours as needed for severe pain.   levETIRAcetam 500 MG tablet Commonly known as:  KEPPRA Take 1 tablet (500 mg total) by mouth 2 (two) times daily.   megestrol 40 MG tablet Commonly known as:  MEGACE Take 1 tablet (40 mg total) by mouth as directed. Take one tablet three times a day for 3 days, then one tablet twice daily for 3 days, then one tablet daily   phenazopyridine 200 MG tablet Commonly known as:  PYRIDIUM Take 1 tablet (200 mg total) by mouth 3 (three) times daily as needed for pain.   SEROQUEL XR 150 MG 24 hr tablet Generic drug:  QUEtiapine Fumarate Take 75 mg by mouth at bedtime.   sulfamethoxazole-trimethoprim 800-160 MG tablet Commonly known as:  BACTRIM DS,SEPTRA DS Take 1 tablet by mouth 2 (two) times daily.   tiotropium 18 MCG inhalation capsule Commonly known as:  SPIRIVA Place 18 mcg into inhaler and inhale daily.        Katrinka Blazing, [place], CNM 12/10/2017  11:44 AM

## 2017-12-11 LAB — GC/CHLAMYDIA PROBE AMP (~~LOC~~) NOT AT ARMC
Chlamydia: NEGATIVE
Neisseria Gonorrhea: NEGATIVE

## 2017-12-29 ENCOUNTER — Ambulatory Visit: Payer: Self-pay | Admitting: Obstetrics and Gynecology

## 2018-01-11 ENCOUNTER — Encounter: Payer: Self-pay | Admitting: Obstetrics

## 2018-01-11 ENCOUNTER — Encounter: Payer: Self-pay | Admitting: *Deleted

## 2018-01-11 ENCOUNTER — Other Ambulatory Visit (HOSPITAL_COMMUNITY)
Admission: RE | Admit: 2018-01-11 | Discharge: 2018-01-11 | Disposition: A | Discharge status: Home / Self Care | Payer: Medicaid Other | Source: Ambulatory Visit | Attending: Obstetrics | Admitting: Obstetrics

## 2018-01-11 ENCOUNTER — Other Ambulatory Visit: Payer: Self-pay

## 2018-01-11 ENCOUNTER — Ambulatory Visit: Payer: Medicaid Other | Admitting: Obstetrics

## 2018-01-11 VITALS — BP 118/79 | HR 101 | Ht 61.0 in | Wt 162.0 lb

## 2018-01-11 DIAGNOSIS — Z01411 Encounter for gynecological examination (general) (routine) with abnormal findings: Secondary | ICD-10-CM | POA: Insufficient documentation

## 2018-01-11 DIAGNOSIS — N898 Other specified noninflammatory disorders of vagina: Secondary | ICD-10-CM | POA: Diagnosis not present

## 2018-01-11 DIAGNOSIS — Z1151 Encounter for screening for human papillomavirus (HPV): Secondary | ICD-10-CM | POA: Insufficient documentation

## 2018-01-11 DIAGNOSIS — N915 Oligomenorrhea, unspecified: Secondary | ICD-10-CM

## 2018-01-11 DIAGNOSIS — Z0000 Encounter for general adult medical examination without abnormal findings: Secondary | ICD-10-CM

## 2018-01-11 DIAGNOSIS — R102 Pelvic and perineal pain unspecified side: Secondary | ICD-10-CM

## 2018-01-11 DIAGNOSIS — F419 Anxiety disorder, unspecified: Secondary | ICD-10-CM

## 2018-01-11 DIAGNOSIS — Z01419 Encounter for gynecological examination (general) (routine) without abnormal findings: Secondary | ICD-10-CM

## 2018-01-11 DIAGNOSIS — G8929 Other chronic pain: Secondary | ICD-10-CM

## 2018-01-11 LAB — POCT URINALYSIS DIPSTICK
Bilirubin, UA: NEGATIVE
GLUCOSE UA: NEGATIVE
Ketones, UA: NEGATIVE
LEUKOCYTES UA: NEGATIVE
Nitrite, UA: NEGATIVE
PROTEIN UA: NEGATIVE
Spec Grav, UA: <=1.005 — AB (ref 1.010–1.025)
Urobilinogen, UA: 0.2 E.U./dL
pH, UA: 6 (ref 5.0–8.0)

## 2018-01-11 MED ORDER — NAPROXEN SODIUM 550 MG PO TABS: 550.0000 mg | ORAL_TABLET | Freq: Two times a day (BID) | ORAL | 5 refills | Status: DC

## 2018-01-11 MED ORDER — KETOROLAC TROMETHAMINE 60 MG/2ML IM SOLN
60.0000 mg | Freq: Once | INTRAMUSCULAR | Status: AC
  Administered 2018-01-11: 60 mg via INTRAMUSCULAR

## 2018-01-11 MED ORDER — ALPRAZOLAM 1 MG PO TABS: 1.0000 mg | ORAL_TABLET | Freq: Two times a day (BID) | ORAL | 2 refills | Status: AC

## 2018-01-11 NOTE — Progress Notes (Signed)
Subjective:        Amanda Valentine is a 42 y.o. female here for a routine exam.  Current complaints: Has had heavy, painful periods for past 2 years, worse over the past year, now severe and interferes with lifestyle. .    Personal health questionnaire:  Is patient Ashkenazi Jewish, have a family history of breast and/or ovarian cancer: no Is there a family history of uterine cancer diagnosed at age < 5, gastrointestinal cancer, urinary tract cancer, family member who is a Personnel officer syndrome-associated carrier: no Is the patient overweight and hypertensive, family history of diabetes, personal history of gestational diabetes, preeclampsia or PCOS: no Is patient over 58, have PCOS,  family history of premature CHD under age 27, diabetes, smoke, have hypertension or peripheral artery disease:  no At any time, has a partner hit, kicked or otherwise hurt or frightened you?: no Over the past 2 weeks, have you felt down, depressed or hopeless?: no Over the past 2 weeks, have you felt little interest or pleasure in doing things?:no   Gynecologic History Patient's last menstrual period was 01/06/2018 (exact date). Contraception: tubal ligation Last Pap: unknown. Results were: unknown Last mammogram: n/a. Results were: n/a  Obstetric History OB History  Gravida Para Term Preterm AB Living  6 4 4   2 4   SAB TAB Ectopic Multiple Live Births  1   1   4     # Outcome Date GA Lbr Len/2nd Weight Sex Delivery Anes PTL Lv  6 Term      Vag-Spont   LIV  5 Term      Vag-Spont   LIV  4 Term      Vag-Spont   LIV  3 Term      Vag-Spont   LIV  2 Ectopic           1 SAB             Past Medical History:  Diagnosis Date  . Anxiety   . Bipolar 1 disorder (HCC)   . Chronic bronchitis (HCC)   . Depression   . Diabetes mellitus without complication (HCC)    diet controlled  . Epilepsy (HCC)   . Hypertension   . Kidney calculi     Past Surgical History:  Procedure Laterality Date  . TUBAL  LIGATION       Current Outpatient Medications:  .  ALPRAZolam (XANAX) 1 MG tablet, Take 1 tablet (1 mg total) by mouth 2 (two) times daily. For anxiety, Disp: 60 tablet, Rfl: 2 .  ketorolac (TORADOL) 10 MG tablet, Take 1 tablet (10 mg total) by mouth every 6 (six) hours as needed for severe pain. (Patient not taking: Reported on 01/11/2018), Disp: 20 tablet, Rfl: 0 .  levETIRAcetam (KEPPRA) 500 MG tablet, Take 1 tablet (500 mg total) by mouth 2 (two) times daily. (Patient not taking: Reported on 01/11/2018), Disp: 60 tablet, Rfl: 0 .  megestrol (MEGACE) 40 MG tablet, Take 1 tablet (40 mg total) by mouth as directed. Take one tablet three times a day for 3 days, then one tablet twice daily for 3 days, then one tablet daily (Patient not taking: Reported on 01/11/2018), Disp: 90 tablet, Rfl: 3 .  naproxen sodium (ANAPROX) 550 MG tablet, Take 1 tablet (550 mg total) by mouth 2 (two) times daily with a meal., Disp: 60 tablet, Rfl: 5 .  phenazopyridine (PYRIDIUM) 200 MG tablet, Take 1 tablet (200 mg total) by mouth 3 (three) times daily as  needed for pain. (Patient not taking: Reported on 01/11/2018), Disp: 12 tablet, Rfl: 0 .  QUEtiapine Fumarate (SEROQUEL XR) 150 MG 24 hr tablet, Take 75 mg by mouth at bedtime., Disp: , Rfl:  .  sulfamethoxazole-trimethoprim (BACTRIM DS,SEPTRA DS) 800-160 MG tablet, Take 1 tablet by mouth 2 (two) times daily. (Patient not taking: Reported on 01/11/2018), Disp: 14 tablet, Rfl: 1 .  tiotropium (SPIRIVA) 18 MCG inhalation capsule, Place 18 mcg into inhaler and inhale daily., Disp: , Rfl:  Allergies  Allergen Reactions  . Acyclovir And Related   . Depakote [Valproic Acid]   . Penicillins Other (See Comments)    Has patient had a PCN reaction causing immediate rash, facial/tongue/throat swelling, SOB or lightheadedness with hypotension: No Has patient had a PCN reaction causing severe rash involving mucus membranes or skin necrosis: No Has patient had a PCN reaction that required  hospitalization No Has patient had a PCN reaction occurring within the last 10 years: No If all of the above answers are "NO", then may proceed with Cephalosporin use.  Erskine Emery Juice [Orange Oil] Rash    Social History   Tobacco Use  . Smoking status: Current Every Day Smoker    Packs/day: 1.00    Types: Cigarettes  . Smokeless tobacco: Never Used  Substance Use Topics  . Alcohol use: Yes    Comment: occ    Family History  Problem Relation Age of Onset  . Seizures Maternal Grandmother   . Diabetes Mellitus II Maternal Grandfather   . Hypertension Other   . CAD Other   . Cancer Other   . Kidney failure Other       Review of Systems  Constitutional: negative for fatigue and weight loss Respiratory: negative for cough and wheezing Cardiovascular: negative for chest pain, fatigue and palpitations Gastrointestinal: negative for abdominal pain and change in bowel habits Musculoskeletal:negative for myalgias Neurological: negative for gait problems and tremors Behavioral/Psych: negative for abusive relationship, depression Endocrine: negative for temperature intolerance    Genitourinary:POSITIVE for abnormal menstrual period, dysuria and vaginal discharge.   Integument/breast: negative for breast lump, breast tenderness, nipple discharge and skin lesion(s)    Objective:       BP 118/79   Pulse (!) 101   Ht 5\' 1"  (1.549 m)   Wt 162 lb (73.5 kg)   LMP 01/06/2018 (Exact Date)   BMI 30.61 kg/m  General:   alert  Skin:   no rash or abnormalities  Lungs:   clear to auscultation bilaterally  Heart:   regular rate and rhythm, S1, S2 normal, no murmur, click, rub or gallop  Breasts:   normal without suspicious masses, skin or nipple changes or axillary nodes  Abdomen:  normal findings: no organomegaly, soft, non-tender and no hernia  Pelvis:  External genitalia: normal general appearance Urinary system: urethral meatus normal and bladder without fullness,  nontender Vaginal: normal without tenderness, induration or masses Cervix: normal appearance Adnexa: normal bimanual exam Uterus: anteverted and tender, normal size   Lab Review Urine pregnancy test Labs reviewed yes Radiologic studies reviewed yes  50% of 20 min visit spent on counseling and coordination of care.   Assessment:     1. Encounter for routine gynecological examination with Papanicolaou smear of cervix Rx: - Cytology - PAP  2. Chronic pelvic pain in female Rx: - Ambulatory referral to Urogynecology - ketorolac (TORADOL) injection 60 mg - POCT Urinalysis Dipstick - Urine Culture - ALPRAZolam (XANAX) 1 MG tablet; Take 1 tablet (1  mg total) by mouth 2 (two) times daily. For anxiety  Dispense: 60 tablet; Refill: 2 - naproxen sodium (ANAPROX) 550 MG tablet; Take 1 tablet (550 mg total) by mouth 2 (two) times daily with a meal.  Dispense: 60 tablet; Refill: 5  3. Vaginal discharge Rx: - Cervicovaginal ancillary only  4. Oligomenorrhea, unspecified type Rx: - TSH  5. Anxiety Rx: - ALPRAZolam (XANAX) 1 MG tablet; Take 1 tablet (1 mg total) by mouth 2 (two) times daily. For anxiety  Dispense: 60 tablet; Refill: 2    Plan:    Education reviewed: calcium supplements, depression evaluation, low fat, low cholesterol diet, safe sex/STD prevention, self breast exams and weight bearing exercise. Follow up in: a few months.   Meds ordered this encounter  Medications  . ketorolac (TORADOL) injection 60 mg  . ALPRAZolam (XANAX) 1 MG tablet    Sig: Take 1 tablet (1 mg total) by mouth 2 (two) times daily. For anxiety    Dispense:  60 tablet    Refill:  2  . naproxen sodium (ANAPROX) 550 MG tablet    Sig: Take 1 tablet (550 mg total) by mouth 2 (two) times daily with a meal.    Dispense:  60 tablet    Refill:  5   Orders Placed This Encounter  Procedures  . Urine Culture  . TSH  . Ambulatory referral to Urogynecology    Referral Priority:   Routine    Referral  Type:   Consultation    Referral Reason:   Specialty Services Required    Requested Specialty:   Urology    Number of Visits Requested:   1  . POCT Urinalysis Dipstick     Brock BadHARLES A. HARPER MD 01-11-2018

## 2018-01-11 NOTE — Progress Notes (Signed)
New GYN presents for AEX/PAP.  C/o of painful, heavy periods 8/10 x 2 yrs. She changes Super+ Tampons every hour, periods are dark brown/black with odor. She has NV everyday. Missed her period from 06/2015-02/2017. She wants surgery.   Office Stock 60 mg Toradol given in LUOQ, tolerated well.   Administrations This Visit    ketorolac (TORADOL) injection 60 mg    Admin Date 01/11/2018 Action Given Dose 60 mg Route Intramuscular Administered By Maretta BeesMcGlashan, Clorine Swing J, RMA

## 2018-01-12 LAB — CERVICOVAGINAL ANCILLARY ONLY
BACTERIAL VAGINITIS: NEGATIVE
CANDIDA VAGINITIS: POSITIVE — AB
CHLAMYDIA, DNA PROBE: NEGATIVE
Neisseria Gonorrhea: NEGATIVE
TRICH (WINDOWPATH): NEGATIVE

## 2018-01-12 LAB — TSH: TSH: 1.51 u[IU]/mL (ref 0.450–4.500)

## 2018-01-13 ENCOUNTER — Other Ambulatory Visit: Payer: Self-pay | Admitting: Obstetrics

## 2018-01-13 DIAGNOSIS — B3731 Acute candidiasis of vulva and vagina: Secondary | ICD-10-CM

## 2018-01-13 DIAGNOSIS — B373 Candidiasis of vulva and vagina: Secondary | ICD-10-CM

## 2018-01-13 LAB — CYTOLOGY - PAP
Diagnosis: NEGATIVE
HPV (WINDOPATH): NOT DETECTED

## 2018-01-13 LAB — URINE CULTURE: Organism ID, Bacteria: NO GROWTH

## 2018-01-13 MED ORDER — FLUCONAZOLE 150 MG PO TABS: 150.0000 mg | ORAL_TABLET | Freq: Once | ORAL | 2 refills | Status: AC

## 2018-04-13 ENCOUNTER — Other Ambulatory Visit: Payer: Self-pay | Admitting: Obstetrics

## 2018-04-13 DIAGNOSIS — G8929 Other chronic pain: Secondary | ICD-10-CM

## 2018-04-13 DIAGNOSIS — R102 Pelvic and perineal pain: Principal | ICD-10-CM

## 2018-04-13 DIAGNOSIS — F419 Anxiety disorder, unspecified: Secondary | ICD-10-CM

## 2018-04-13 NOTE — Telephone Encounter (Signed)
Refill reqest: Please send if approved.  Name from pharmacy: ALPRAZOLAM 1 MG TABLET        Will file in chart as: ALPRAZolam (XANAX) 1 MG tablet   Sig: Take 1 tablet (1 mg total) by mouth 2 (two) times daily. For anxiety   Disp:  60 tablet  Refills:  2   Start: 04/13/2018   Class: Normal   For: Chronic pelvic pain in female, Anxiety   To pharmacy: Not to exceed 3 additional fills before 07/10/2018 DX Code Needed .   Last ordered: 3 months ago by Brock Bad, MD Last refill: 03/09/2018   Rx #: [id number]     To be filled at: [id number] - [place], [place] - [address]

## 2018-11-03 ENCOUNTER — Telehealth: Payer: Self-pay

## 2018-11-03 NOTE — Telephone Encounter (Signed)
Due to current COVID 19 pandemic, our office is severely reducing in office visits for at least the next 2 weeks, in order to minimize the risk to our patients and healthcare providers.   I called pt to offer her a sooner virtual visit with Dr. Anne Hahn. She declined a sooner appt at this time. She is agreeable to a virtual visit, however, she would like a call closer to her appt to discuss it further. I asked her to call me back if she changes her mind.

## 2018-11-22 NOTE — Telephone Encounter (Signed)
I contacted the pt in regards to her 11/23/18 appt. Pt was advised, due to current COVID 19 pandemic, our office is severely reducing in office visits until further notice, in order to minimize the risk to our patients and healthcare providers.   Pt was offered a virtual video visit at 11/23/18 at 2 pm and accepted.    Pt understands that although there may be some limitations with this type of visit, we will take all precautions to reduce any security or privacy concerns.  Pt understands that this will be treated like an in office visit and we will file with pt's insurance, and there may be a patient responsible charge related to this service.  Pt's e-mail is [email or web address], link has been sent.  Pt's EMR has been updated for 11/23/18 visit.

## 2018-11-22 NOTE — Addendum Note (Signed)
Addended by: Ann Maki T on: 11/22/2018 09:26 AM   Modules accepted: Orders

## 2018-11-23 ENCOUNTER — Ambulatory Visit (INDEPENDENT_AMBULATORY_CARE_PROVIDER_SITE_OTHER): Payer: Medicaid Other | Admitting: Neurology

## 2018-11-23 ENCOUNTER — Telehealth: Payer: Self-pay | Admitting: Neurology

## 2018-11-23 ENCOUNTER — Encounter: Payer: Self-pay | Admitting: Neurology

## 2018-11-23 ENCOUNTER — Other Ambulatory Visit: Payer: Self-pay

## 2018-11-23 DIAGNOSIS — R569 Unspecified convulsions: Secondary | ICD-10-CM

## 2018-11-23 NOTE — Telephone Encounter (Signed)
This patient did not show for a virtual new patient appointment today.

## 2018-11-23 NOTE — Progress Notes (Signed)
This patient did not show for her virtual visit today.

## 2019-02-17 ENCOUNTER — Encounter: Payer: Self-pay | Admitting: Family Medicine

## 2019-03-09 ENCOUNTER — Ambulatory Visit: Payer: Medicaid Other | Admitting: Obstetrics & Gynecology

## 2019-05-06 IMAGING — US US ART/VEN ABD/PELV/SCROTUM DOPPLER LTD
1 series · 14 of 25 positions shown · non-contrast
Comparison: Ultrasound September 01, 2006.

CLINICAL DATA: Pelvic pain and vaginal bleeding for 1 month.

EXAM:
TRANSABDOMINAL AND TRANSVAGINAL ULTRASOUND OF PELVIS
TECHNIQUE: Both transabdominal and transvaginal ultrasound examinations of the
pelvis were performed. Transabdominal technique was performed for
global imaging of the pelvis including uterus, ovaries, adnexal
regions, and pelvic cul-de-sac. It was necessary to proceed with
endovaginal exam following the transabdominal exam to visualize the
endometrium and ovaries.

[Series 1: us art/ven abd/pelv/scrotum doppler ltd · 0.18mm/px · 14 of 71 slices shown]
[im 1/71]
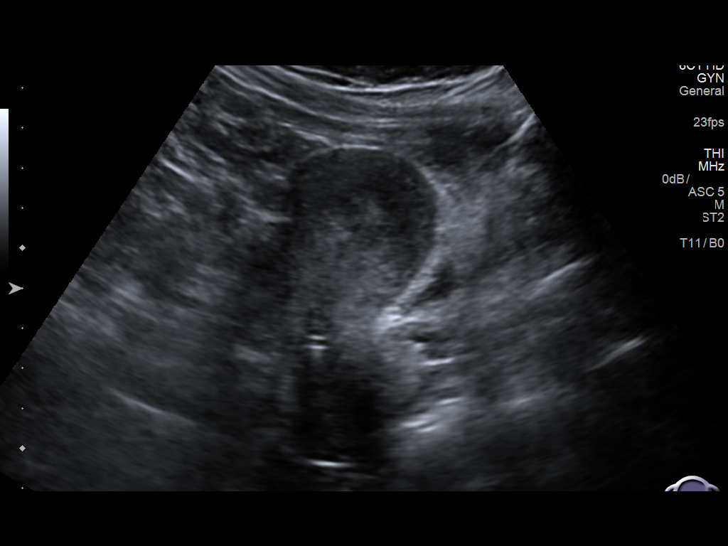
[im 6/71]
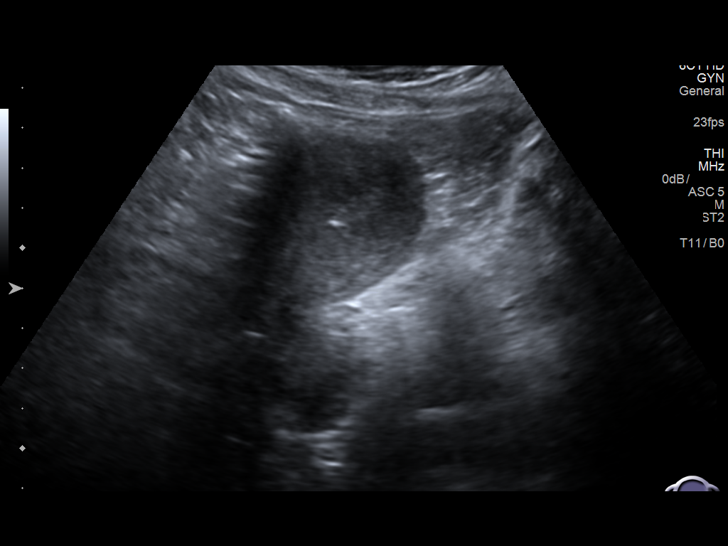
[im 12/71]
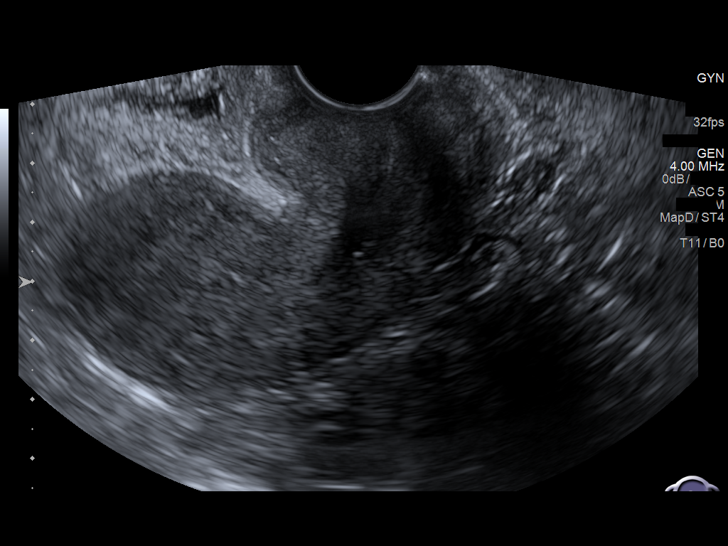
[im 18/71]
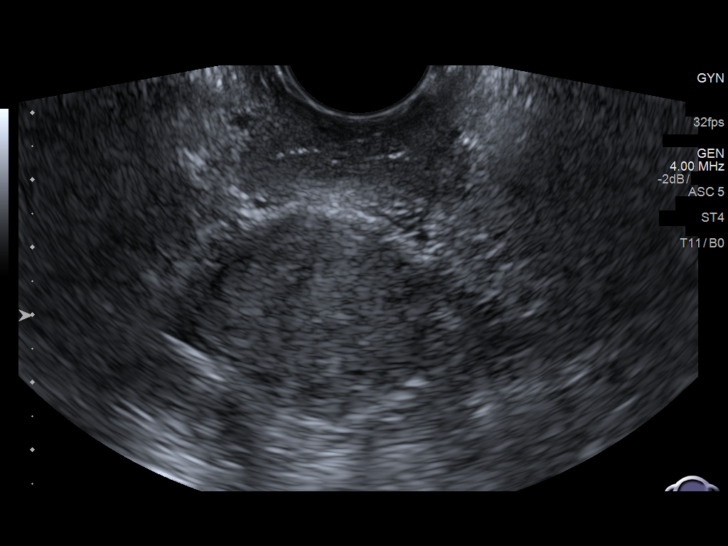
[im 24/71]
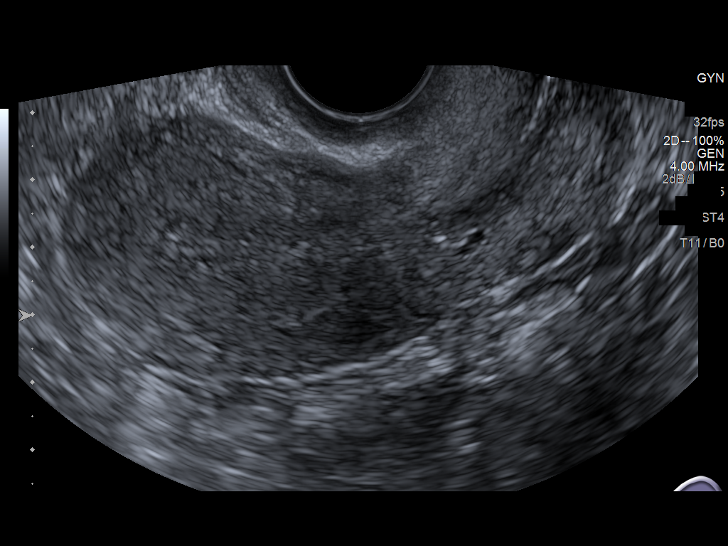
[im 27/71]
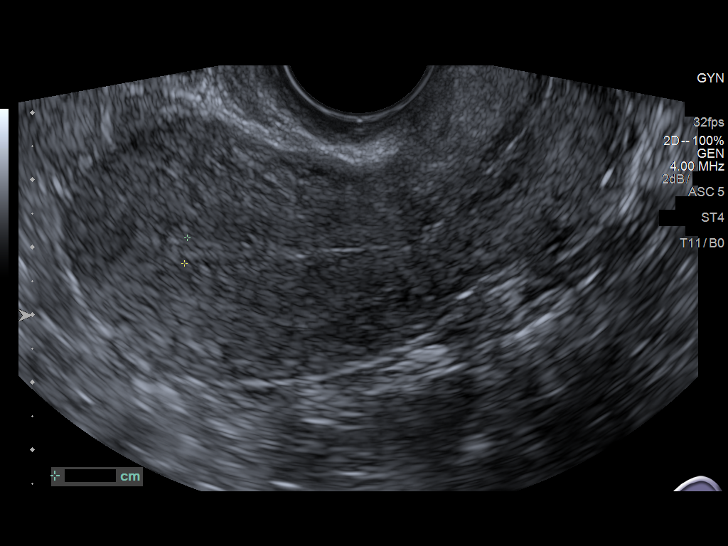
[im 33/71]
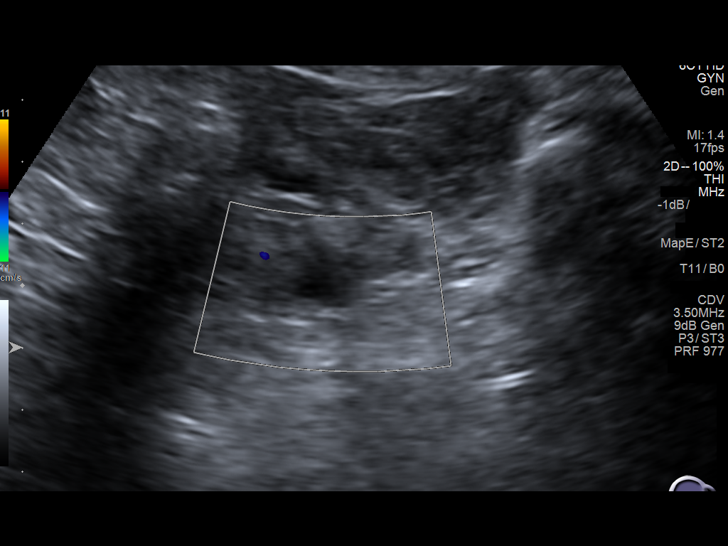
[im 38/71]
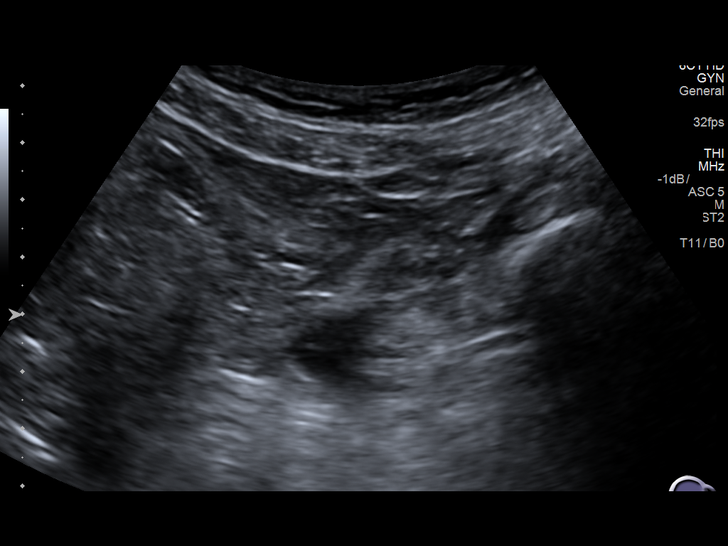
[im 44/71]
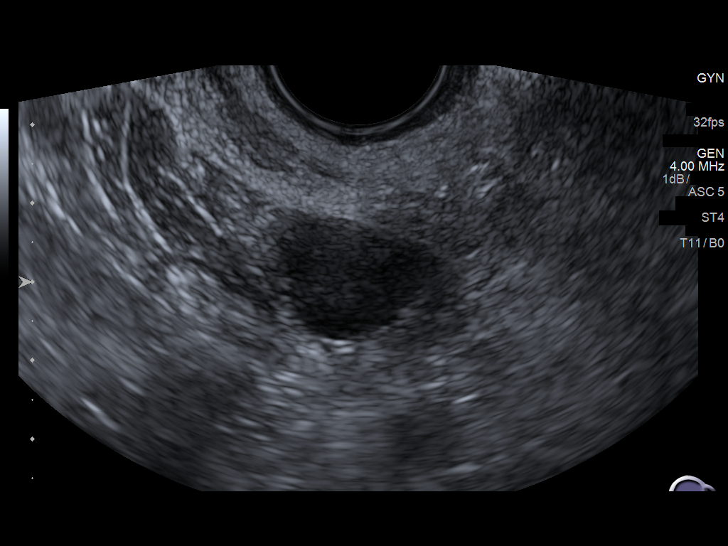
[im 47/71]
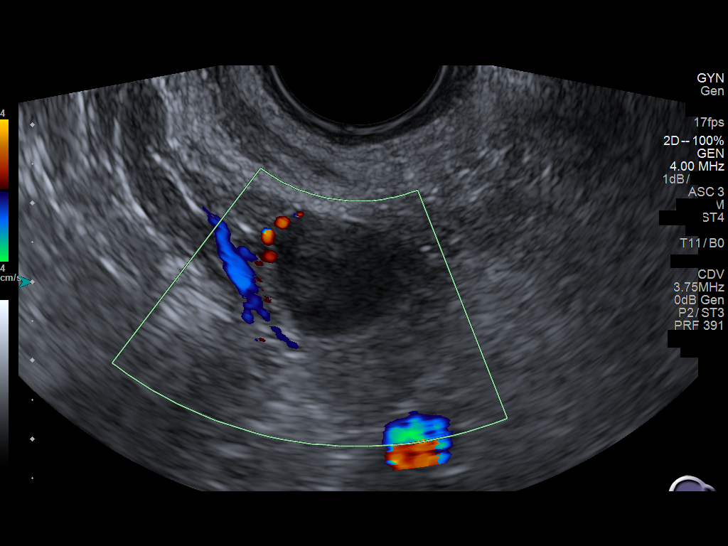
[im 53/71]
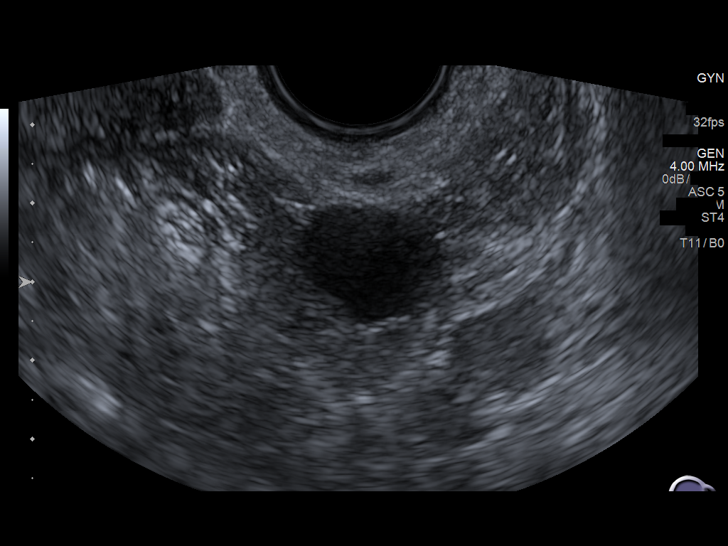
[im 59/71]
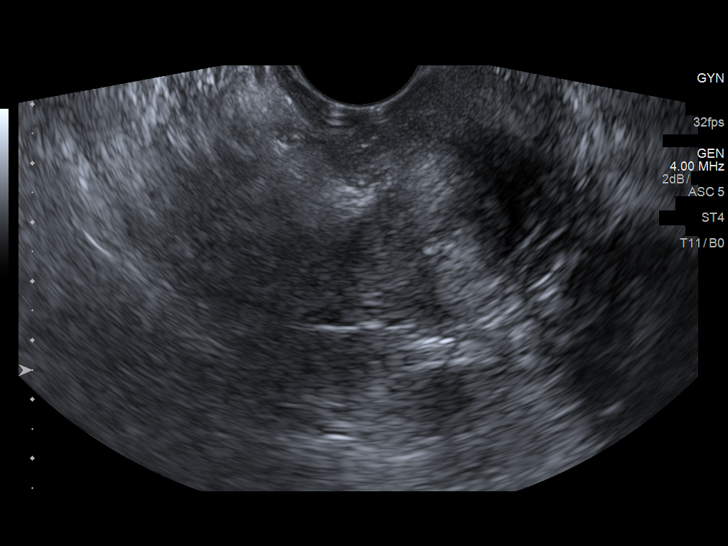
[im 65/71]
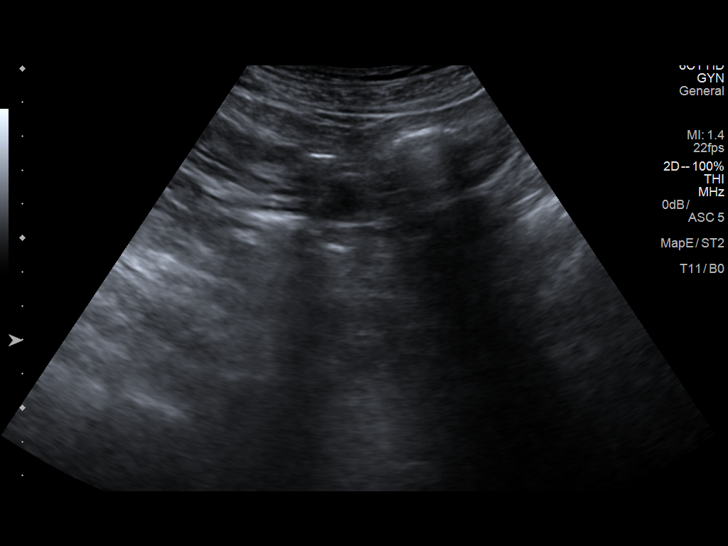
[im 71/71]
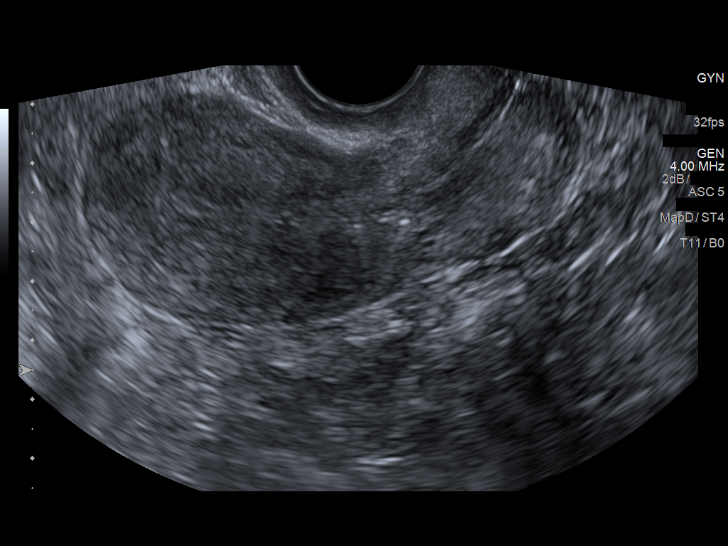

[14 of 25 positions shown; findings below may reference images not displayed]

FINDINGS: Uterus

Measurements: 7.6 x 4.1 x 3.9 cm. No fibroids or other mass
visualized.

Endometrium

Thickness: 3.8 mm which is within normal limits. No focal
abnormality visualized.

Right ovary

Not visualized due to overlying bowel.

Left ovary

Measurements: 2.6 x 2.4 x 2.1 cm. Normal appearance/no adnexal mass.
Doppler demonstrates normal arterial and venous waveforms in left
ovary.

Other findings

No abnormal free fluid.
IMPRESSION: Right ovary is not visualized due to overlying bowel. No other
abnormality seen in the pelvis.

## 2020-05-29 ENCOUNTER — Emergency Department (HOSPITAL_COMMUNITY)
Admission: EM | Admit: 2020-05-29 | Discharge: 2020-05-29 | Disposition: A | Discharge status: Home / Self Care | Payer: Medicaid Other | Attending: Emergency Medicine | Admitting: Emergency Medicine

## 2020-05-29 ENCOUNTER — Other Ambulatory Visit: Payer: Self-pay

## 2020-05-29 ENCOUNTER — Encounter (HOSPITAL_COMMUNITY): Payer: Self-pay | Admitting: Emergency Medicine

## 2020-05-29 DIAGNOSIS — Z5321 Procedure and treatment not carried out due to patient leaving prior to being seen by health care provider: Secondary | ICD-10-CM | POA: Diagnosis not present

## 2020-05-29 DIAGNOSIS — R1030 Lower abdominal pain, unspecified: Secondary | ICD-10-CM | POA: Insufficient documentation

## 2020-05-29 DIAGNOSIS — N939 Abnormal uterine and vaginal bleeding, unspecified: Secondary | ICD-10-CM | POA: Diagnosis not present

## 2020-05-29 LAB — BASIC METABOLIC PANEL
Anion gap: 12 (ref 5–15)
BUN: 15 mg/dL (ref 6–20)
CO2: 19 mmol/L — ABNORMAL LOW (ref 22–32)
Calcium: 10.3 mg/dL (ref 8.9–10.3)
Chloride: 106 mmol/L (ref 98–111)
Creatinine, Ser: 0.9 mg/dL (ref 0.44–1.00)
GFR, Estimated: >60 mL/min (ref >60)
Glucose, Bld: 126 mg/dL — ABNORMAL HIGH (ref 70–99)
Potassium: 3.8 mmol/L (ref 3.5–5.1)
Sodium: 137 mmol/L (ref 135–145)

## 2020-05-29 LAB — CBC WITH DIFFERENTIAL/PLATELET
Abs Immature Granulocytes: 0.03 10*3/uL (ref 0.00–0.07)
Basophils Absolute: 0 10*3/uL (ref 0.0–0.1)
Basophils Relative: 0 %
Eosinophils Absolute: 0 10*3/uL (ref 0.0–0.5)
Eosinophils Relative: 0 %
HCT: 37.8 % (ref 36.0–46.0)
Hemoglobin: 13.1 g/dL (ref 12.0–15.0)
Immature Granulocytes: 0 %
Lymphocytes Relative: 18 %
Lymphs Abs: 1.2 10*3/uL (ref 0.7–4.0)
MCH: 34.8 pg — ABNORMAL HIGH (ref 26.0–34.0)
MCHC: 34.7 g/dL (ref 30.0–36.0)
MCV: 100.5 fL — ABNORMAL HIGH (ref 80.0–100.0)
Monocytes Absolute: 0.6 10*3/uL (ref 0.1–1.0)
Monocytes Relative: 9 %
Neutro Abs: 4.9 10*3/uL (ref 1.7–7.7)
Neutrophils Relative %: 73 %
Platelets: 284 10*3/uL (ref 150–400)
RBC: 3.76 MIL/uL — ABNORMAL LOW (ref 3.87–5.11)
RDW: 13.7 % (ref 11.5–15.5)
WBC: 6.8 10*3/uL (ref 4.0–10.5)
nRBC: 0 % (ref 0.0–0.2)

## 2020-05-29 LAB — I-STAT BETA HCG BLOOD, ED (MC, WL, AP ONLY): I-stat hCG, quantitative: <5 m[IU]/mL (ref <5)

## 2020-05-29 NOTE — ED Triage Notes (Addendum)
Patient reports vaginal bleeding x 2 months , patient added that she passed a fetus at the bathroom at [facility] this evening with low abdominal pain . Patient evaluated by PA at triage .

## 2020-05-29 NOTE — ED Notes (Signed)
PA advised triage nurse that patient can wait to be seen , will not transfer to [facility].

## 2020-05-29 NOTE — ED Notes (Signed)
Pt wanted to leave escorted to car by this tech and security Pt left.

## 2020-05-29 NOTE — ED Provider Notes (Cosign Needed)
MSE was initiated and I personally evaluated the patient and placed orders (if any) at  3:03 AM on May 29, 2020.  Patient to ED with vaginal bleeding, low abdominal cramping and reporting multiple positive pregnancy tests at home. She states she was told she was in "pre-menopause" previously with irregular periods. She states when she does have a period, she bleeds heavily for one month. Her current vaginal bleeding started one month ago. She also reports she think she passed a fetus while using the bathroom earlier tonight.   No vomiting, lightheadedness, syncope. VSS, tachycardic 118. Beta HCG pending.   Pregnancy test is negative. The patient has been screened and is medically stable to wait to be evaluated for heavy vaginal bleeding if she wants to stay.    The patient appears stable so that the remainder of the MSE may be completed by another provider.   Elpidio Anis, PA-C 05/29/20 [phone]

## 2020-12-20 ENCOUNTER — Emergency Department (HOSPITAL_BASED_OUTPATIENT_CLINIC_OR_DEPARTMENT_OTHER)
Admission: EM | Admit: 2020-12-20 | Discharge: 2020-12-20 | Disposition: A | Discharge status: Home / Self Care | Payer: Medicaid Other | Attending: Emergency Medicine | Admitting: Emergency Medicine

## 2020-12-20 ENCOUNTER — Encounter (HOSPITAL_BASED_OUTPATIENT_CLINIC_OR_DEPARTMENT_OTHER): Payer: Self-pay | Admitting: *Deleted

## 2020-12-20 ENCOUNTER — Other Ambulatory Visit: Payer: Self-pay

## 2020-12-20 DIAGNOSIS — Z5321 Procedure and treatment not carried out due to patient leaving prior to being seen by health care provider: Secondary | ICD-10-CM | POA: Insufficient documentation

## 2020-12-20 DIAGNOSIS — N939 Abnormal uterine and vaginal bleeding, unspecified: Secondary | ICD-10-CM | POA: Diagnosis present

## 2020-12-20 NOTE — ED Triage Notes (Signed)
Vaginal bleeding x 4 days. States she wants to schedule a hysterectomy ASAP.

## 2021-02-12 ENCOUNTER — Emergency Department (HOSPITAL_COMMUNITY)
Admission: EM | Admit: 2021-02-12 | Discharge: 2021-02-13 | Discharge status: Against Medical Advice | Payer: Medicaid Other | Attending: Emergency Medicine | Admitting: Emergency Medicine

## 2021-02-12 ENCOUNTER — Other Ambulatory Visit: Payer: Self-pay

## 2021-02-12 ENCOUNTER — Encounter (HOSPITAL_COMMUNITY): Payer: Self-pay | Admitting: Emergency Medicine

## 2021-02-12 DIAGNOSIS — Y92007 Garden or yard of unspecified non-institutional (private) residence as the place of occurrence of the external cause: Secondary | ICD-10-CM | POA: Insufficient documentation

## 2021-02-12 DIAGNOSIS — Z5321 Procedure and treatment not carried out due to patient leaving prior to being seen by health care provider: Secondary | ICD-10-CM | POA: Diagnosis not present

## 2021-02-12 DIAGNOSIS — R21 Rash and other nonspecific skin eruption: Secondary | ICD-10-CM | POA: Insufficient documentation

## 2021-02-12 DIAGNOSIS — K6289 Other specified diseases of anus and rectum: Secondary | ICD-10-CM | POA: Diagnosis not present

## 2021-02-12 DIAGNOSIS — T7621XA Adult sexual abuse, suspected, initial encounter: Secondary | ICD-10-CM | POA: Diagnosis not present

## 2021-02-12 NOTE — ED Notes (Signed)
Pt came inside and stated that she was leaving that she was not waiting this long to be seen. Visitor asked if patient could be moved back within the next hour. This NT requested that pt stay and attempted to explain wait process to visitor but pt upset and stormed out door.
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
## 2021-03-07 ENCOUNTER — Ambulatory Visit
Admission: RE | Admit: 2021-03-07 | Discharge: 2021-03-07 | Disposition: A | Discharge status: Home / Self Care | Payer: Medicaid Other | Source: Ambulatory Visit | Attending: Nurse Practitioner | Admitting: Nurse Practitioner

## 2021-03-07 ENCOUNTER — Other Ambulatory Visit: Payer: Self-pay

## 2021-03-07 ENCOUNTER — Other Ambulatory Visit: Payer: Self-pay | Admitting: Nurse Practitioner

## 2021-03-07 DIAGNOSIS — Z1231 Encounter for screening mammogram for malignant neoplasm of breast: Secondary | ICD-10-CM

## 2021-03-08 ENCOUNTER — Other Ambulatory Visit: Payer: Self-pay | Admitting: Nurse Practitioner

## 2021-03-08 DIAGNOSIS — N6452 Nipple discharge: Secondary | ICD-10-CM

## 2021-03-28 ENCOUNTER — Inpatient Hospital Stay: Admission: RE | Admit: 2021-03-28 | Payer: Medicaid Other | Source: Ambulatory Visit

## 2021-03-28 ENCOUNTER — Other Ambulatory Visit: Payer: Medicaid Other

## 2021-08-07 ENCOUNTER — Institutional Professional Consult (permissible substitution): Payer: Medicaid Other | Admitting: Neurology

## 2021-12-14 ENCOUNTER — Other Ambulatory Visit: Payer: Self-pay

## 2021-12-14 ENCOUNTER — Emergency Department (HOSPITAL_COMMUNITY)
Admission: EM | Admit: 2021-12-14 | Discharge: 2021-12-14 | Disposition: A | Discharge status: Home / Self Care | Payer: Medicaid Other | Attending: Emergency Medicine | Admitting: Emergency Medicine

## 2021-12-14 ENCOUNTER — Encounter (HOSPITAL_COMMUNITY): Payer: Self-pay | Admitting: Emergency Medicine

## 2021-12-14 DIAGNOSIS — D72819 Decreased white blood cell count, unspecified: Secondary | ICD-10-CM | POA: Diagnosis not present

## 2021-12-14 DIAGNOSIS — F17210 Nicotine dependence, cigarettes, uncomplicated: Secondary | ICD-10-CM | POA: Insufficient documentation

## 2021-12-14 DIAGNOSIS — M545 Low back pain, unspecified: Secondary | ICD-10-CM | POA: Diagnosis present

## 2021-12-14 DIAGNOSIS — N3000 Acute cystitis without hematuria: Secondary | ICD-10-CM | POA: Diagnosis not present

## 2021-12-14 DIAGNOSIS — E119 Type 2 diabetes mellitus without complications: Secondary | ICD-10-CM | POA: Insufficient documentation

## 2021-12-14 DIAGNOSIS — M5442 Lumbago with sciatica, left side: Secondary | ICD-10-CM | POA: Insufficient documentation

## 2021-12-14 DIAGNOSIS — M79605 Pain in left leg: Secondary | ICD-10-CM | POA: Diagnosis not present

## 2021-12-14 DIAGNOSIS — I10 Essential (primary) hypertension: Secondary | ICD-10-CM | POA: Insufficient documentation

## 2021-12-14 LAB — URINALYSIS, ROUTINE W REFLEX MICROSCOPIC
Bilirubin Urine: NEGATIVE
Glucose, UA: NEGATIVE mg/dL
Ketones, ur: NEGATIVE mg/dL
Nitrite: NEGATIVE
Protein, ur: NEGATIVE mg/dL
Specific Gravity, Urine: 1.012 (ref 1.005–1.030)
pH: 5 (ref 5.0–8.0)

## 2021-12-14 MED ORDER — NITROFURANTOIN MONOHYD MACRO 100 MG PO CAPS: 100.0000 mg | ORAL_CAPSULE | Freq: Two times a day (BID) | ORAL | 0 refills | Status: DC

## 2021-12-14 MED ORDER — KETOROLAC TROMETHAMINE 15 MG/ML IJ SOLN
15.0000 mg | Freq: Once | INTRAMUSCULAR | Status: AC
Start: 2021-12-14 — End: 2021-12-14
  Administered 2021-12-14: 15 mg via INTRAMUSCULAR
  Filled 2021-12-14: qty 1

## 2021-12-14 MED ORDER — METHOCARBAMOL 500 MG PO TABS: 500.0000 mg | ORAL_TABLET | Freq: Two times a day (BID) | ORAL | 0 refills | Status: DC

## 2021-12-14 MED ORDER — PREDNISONE 20 MG PO TABS
60.0000 mg | ORAL_TABLET | Freq: Once | ORAL | Status: AC
Start: 2021-12-14 — End: 2021-12-14
  Administered 2021-12-14: 60 mg via ORAL
  Filled 2021-12-14: qty 3

## 2021-12-14 MED ORDER — LIDOCAINE 5 % EX PTCH: 1.0000 | MEDICATED_PATCH | CUTANEOUS | 0 refills | Status: DC

## 2021-12-14 MED ORDER — PREDNISONE 10 MG PO TABS: 40.0000 mg | ORAL_TABLET | Freq: Every day | ORAL | 0 refills | Status: DC

## 2021-12-14 MED ORDER — LIDOCAINE 5 % EX PTCH
1.0000 | MEDICATED_PATCH | CUTANEOUS | Status: DC
  Administered 2021-12-14: 1 via TRANSDERMAL
  Filled 2021-12-14: qty 1

## 2021-12-14 NOTE — Discharge Instructions (Addendum)
Your back pain is most likely secondary to sciatica.  I will prescribe muscle relaxer, prednisone, Lidoderm patches for back pain.  Take prednisone once a day 40 milligrams starting tomorrow because you got your first dose today.  Take methocarbamol and use Lidoderm patches as needed.  Note that methocarbamol "muscle laxer" can cause drowsiness so please do not drive while taking. I have attached information regarding sciatica and urinary tract infection.  Please both sections thoroughly.  Sciatica packet has useful information for stretching as well as other symptomatic therapy. He also showed evidence of a urinary tract infection.  I will prescribe an antibiotic called nitrofurantoin or Macrobid to take twice a day for the next 5 days. I will also provide follow-up information for orthopedics for your left knee pain as well as neurology for further management of your sciatica type symptoms. Please do not hesitate to return to emergency department if the worrisome signs and symptoms we discussed become apparent.

## 2021-12-14 NOTE — ED Provider Notes (Signed)
[facility] EMERGENCY DEPARTMENT Provider Note   CSN: [id number] Arrival date & time: 12/14/21  0820     History  Chief Complaint  Patient presents with   Back Pain    Amanda Valentine is a 46 y.o. female.   Back Pain Associated symptoms: dysuria   Associated symptoms: no abdominal pain, no chest pain and no fever    46 year old female presents emergency department with back pain.  She states that her back pain has been chronic in nature with a flareup about a week and a half ago.  She states that she woke up 1 day and began to feel this way and denies trauma/fall/known injury.  She states that it is similar nature to previous events.  Back pain has located left lower lumbar area with pain radiating down her left leg.  Pain is worse with movement and relieved with rest.  Patient has tried a "neighbors Flexeril" with minimal relief.  She repeatedly states she is on Suboxone and that is not helping her pain.  She secondarily endorses feelings of dysuria without hematuria, frequency, urgency.  She denies fever, chills, night sweats, IV drug use, steroid use, known cancer diagnosis, saddle anesthesia, weakness/sensory deficits, bowel/bladder dysfunction, history of IV drug use.  Denies chest pain, shortness of breath, abdominal pain, nausea/vomiting/diarrhea, vaginal symptoms, change in bowel habits.  Past medical history significant for anxiety, bipolar 1, chronic bronchitis, GI, depression, diabetes mellitus 2, epilepsy, hypertension.   Home Medications Prior to Admission medications   Medication Sig Start Date End Date Taking? Authorizing Provider  lidocaine (LIDODERM) 5 % Place 1 patch onto the skin daily. Remove & Discard patch within 12 hours or as directed by MD 12/14/21  Yes Dion Saucier A, PA  methocarbamol (ROBAXIN) 500 MG tablet Take 1 tablet (500 mg total) by mouth 2 (two) times daily. 12/14/21  Yes Dion Saucier A, PA  nitrofurantoin, macrocrystal-monohydrate,  (MACROBID) 100 MG capsule Take 1 capsule (100 mg total) by mouth 2 (two) times daily. 12/14/21  Yes Dion Saucier A, PA  predniSONE (DELTASONE) 10 MG tablet Take 4 tablets (40 mg total) by mouth daily. 12/14/21  Yes Dion Saucier A, PA  ALPRAZolam Duanne Moron) 1 MG tablet Take 1 tablet (1 mg total) by mouth 2 (two) times daily. For anxiety 01/11/18   Shelly Bombard, MD  levETIRAcetam (KEPPRA) 500 MG tablet Take 1 tablet (500 mg total) by mouth 2 (two) times daily. Patient taking differently: Take 500 mg by mouth 2 (two) times daily. Patient states she takes sometimes not everyday. 06/05/17   Kirichenko, Lahoma Rocker, PA-C  megestrol (MEGACE) 40 MG tablet Take 1 tablet (40 mg total) by mouth as directed. Take one tablet three times a day for 3 days, then one tablet twice daily for 3 days, then one tablet daily 12/10/17   Tamala Julian, [place], CNM  NON FORMULARY Suboxone 8 mg    [provider]      Allergies    Acyclovir and related, Depakote [valproic acid], Penicillins, and Orange juice [orange oil]    Review of Systems   Review of Systems  Constitutional:  Negative for chills and fever.  HENT:  Negative for ear pain and sore throat.   Eyes:  Negative for pain and visual disturbance.  Respiratory:  Negative for cough and shortness of breath.   Cardiovascular:  Negative for chest pain and palpitations.  Gastrointestinal:  Negative for abdominal pain and vomiting.  Genitourinary:  Positive for dysuria. Negative for frequency and  hematuria.  Musculoskeletal:  Positive for back pain. Negative for arthralgias.       Shooting pain down left leg.   Skin:  Negative for color change and rash.  Neurological:  Negative for seizures and syncope.  All other systems reviewed and are negative.  Physical Exam Updated Vital Signs BP (!) 130/109 (BP Location: Right Arm)   Pulse (!) 101   Temp 98.7 F (37.1 C) (Oral)   Resp 18   SpO2 98%  Physical Exam Vitals and nursing note reviewed.  Constitutional:       General: She is not in acute distress.    Appearance: Normal appearance. She is well-developed. She is not ill-appearing, toxic-appearing or diaphoretic.  HENT:     Head: Normocephalic and atraumatic.  Eyes:     General:        Right eye: No discharge.        Left eye: No discharge.     Extraocular Movements: Extraocular movements intact.     Conjunctiva/sclera: Conjunctivae normal.  Cardiovascular:     Rate and Rhythm: Normal rate and regular rhythm.     Heart sounds: No murmur heard.    Comments: Posterior tibial and radial pulses 2+ and equal bilaterally. Pulmonary:     Effort: Pulmonary effort is normal. No respiratory distress.     Breath sounds: Normal breath sounds.  Abdominal:     General: Abdomen is flat. Bowel sounds are normal.     Palpations: Abdomen is soft.     Tenderness: There is no abdominal tenderness. There is no right CVA tenderness, left CVA tenderness or guarding.  Musculoskeletal:        General: No swelling.     Cervical back: Normal, normal range of motion and neck supple. No rigidity or tenderness.     Thoracic back: Normal.     Lumbar back: Spasms and tenderness present. No bony tenderness. Positive left straight leg raise test. Negative right straight leg raise test.     Right lower leg: No edema.     Left lower leg: No edema.     Comments: Left paraspinal tenderness with obvious spasm noted in lumbar region. No overlying skin abnormalities noted.  No evidence of trauma  Skin:    General: Skin is warm and dry.     Capillary Refill: Capillary refill takes less than 2 seconds.  Neurological:     General: No focal deficit present.     Mental Status: She is alert and oriented to person, place, and time.  Psychiatric:        Mood and Affect: Mood normal.        Behavior: Behavior normal.    ED Results / Procedures / Treatments   Labs (all labs ordered are listed, but only abnormal results are displayed) Labs Reviewed  URINALYSIS, ROUTINE W  REFLEX MICROSCOPIC - Abnormal; Notable for the following components:      Result Value   APPearance CLOUDY (*)    Hgb urine dipstick LARGE (*)    Leukocytes,Ua SMALL (*)    Bacteria, UA RARE (*)    All other components within normal limits    EKG None  Radiology No results found.  Procedures Procedures    Medications Ordered in ED Medications  lidocaine (LIDODERM) 5 % 1 patch (1 patch Transdermal Patch Applied 12/14/21 0906)  ketorolac (TORADOL) 15 MG/ML injection 15 mg (15 mg Intramuscular Given 12/14/21 0905)  predniSONE (DELTASONE) tablet 60 mg (60 mg Oral Given 12/14/21 0903)  ED Course/ Medical Decision Making/ A&P                           Medical Decision Making Amount and/or Complexity of Data Reviewed Labs: ordered.  Risk Prescription drug management.   This patient presents to the ED for concern of back pain, this involves an extensive number of treatment options, and is a complaint that carries with it a high risk of complications and morbidity.  The differential diagnosis includes The emergent differential diagnosis for back pain includes but is not limited to fracture, muscle strain, cauda equina, spinal stenosis. DDD, ankylosing spondylitis, acute ligamentous injury, disk herniation, spondylolisthesis, Epidural compression syndrome, metastatic cancer, transverse myelitis, vertebral osteomyelitis, diskitis, kidney stone, pyelonephritis, AAA, Perforated ulcer, Retrocecal appendicitis, pancreatitis, bowel obstruction, retroperitoneal hemorrhage or mass, meningitis.   Co morbidities that complicate the patient evaluation  Anxiety, bipolar 1, chronic bronchitis, GI, depression, diabetes mellitus 2, epilepsy, hypertension.    Lab Tests:  I Ordered, and personally interpreted labs.  The pertinent results include:  UA significant for large hemoglobin, small leukocytes and rare bacteria    Imaging Studies ordered:  N/a   Cardiac Monitoring: / EKG:  The  patient was maintained on a cardiac monitor.  I personally viewed and interpreted the cardiac monitored which showed an underlying rhythm of: Sinus rhythm   Consultations Obtained:  N/a   Problem List / ED Course / Critical interventions / Medication management  Back pain I ordered medication including Lidoderm, prednisone, Toradol for back pain  Reevaluation of the patient after these medicines showed that the patient improved I have reviewed the patients home medicines and have made adjustments as needed   Social Determinants of Health:  Chronic cigarette smoker.  Prior opioid abuser.  Current Suboxone therapy.   Test / Admission - Considered:  Vitals signs significant for mild tachycardia with a rate of 101 and mild hypertension with a blood pressure 130/109.  Recommend close PCP follow-up for further evaluation/treatment. Otherwise within normal range and stable throughout visit. Laboratory suspicious for UTI though mildly contaminated.  Will treat with antibiotics for the next 5 days.  Low back pain to be treated outpatient with Tylenol, Lidoderm patches, methocarbamol for symptomatic therapy.  Chronic NSAIDs to be avoided secondary to recent GI bleed. Patient advised not to drive while taking methocarbamol secondary due to potential sedative side effects.  Rest, ice affected area recommended. Information to contact neurosurgery provided for further management/treatment of patient's acute on chronic condition. Worrisome signs and symptoms were discussed with the patient, and the patient acknowledged understanding to return to the ED if noticed. Patient was stable upon discharge.           Final Clinical Impression(s) / ED Diagnoses Final diagnoses:  Acute left-sided low back pain with left-sided sciatica  Acute cystitis without hematuria    Rx / DC Orders ED Discharge Orders          Ordered    predniSONE (DELTASONE) 10 MG tablet  Daily        12/14/21 0942     lidocaine (LIDODERM) 5 %  Every 24 hours        12/14/21 0942    methocarbamol (ROBAXIN) 500 MG tablet  2 times daily        12/14/21 0942    nitrofurantoin, macrocrystal-monohydrate, (MACROBID) 100 MG capsule  2 times daily        12/14/21 1031  Wilnette Kales, [place] 12/14/21 [place], [place], DO 12/14/21 1412

## 2021-12-14 NOTE — ED Triage Notes (Signed)
C/o lower back pain that radiates down L leg and numbness to bilateral hands x 1 1/2 weeks.  Denies injury.

## 2022-01-06 ENCOUNTER — Ambulatory Visit: Payer: Medicaid Other | Admitting: Neurology

## 2022-01-06 ENCOUNTER — Encounter: Payer: Self-pay | Admitting: Neurology

## 2022-01-06 VITALS — BP 125/72 | HR 80 | Ht 61.0 in | Wt 128.5 lb

## 2022-01-06 DIAGNOSIS — G4486 Cervicogenic headache: Secondary | ICD-10-CM | POA: Diagnosis not present

## 2022-01-06 DIAGNOSIS — G5603 Carpal tunnel syndrome, bilateral upper limbs: Secondary | ICD-10-CM | POA: Diagnosis not present

## 2022-01-06 MED ORDER — GABAPENTIN 300 MG PO CAPS: 300.0000 mg | ORAL_CAPSULE | Freq: Two times a day (BID) | ORAL | 3 refills | Status: DC
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
## 2022-03-05 ENCOUNTER — Telehealth: Payer: Self-pay | Admitting: Neurology

## 2022-03-05 NOTE — Telephone Encounter (Signed)
Called pt to reschedule 9/26 appt (Dr. Teresa Coombs out of office). Had a long discussion with pt as she became very upset and stated that she would like to see a different provider in our office. She states she felt she was not listened to the last time she was here and was not comfortable taking the gabapentin she was prescribed due to past issues with addiction. Pt states she would like to come back to our office and been seen by another provider as she feels she is still having issues that she states are "in the skull" and not carpal tunnel. Please advise.

## 2022-03-06 NOTE — Telephone Encounter (Signed)
Thank you.  Amanda Valentine

## 2022-03-06 NOTE — Telephone Encounter (Signed)
I am ok with the switch 

## 2022-03-10 ENCOUNTER — Telehealth: Payer: Self-pay | Admitting: Diagnostic Neuroimaging

## 2022-03-10 NOTE — Telephone Encounter (Signed)
LVM asking pt to call back to schedule appointment with new provider. Pt is to be scheduled with Dr. Marjory Lies

## 2022-04-08 ENCOUNTER — Ambulatory Visit: Payer: Medicaid Other | Admitting: Neurology

## 2022-07-28 ENCOUNTER — Ambulatory Visit: Payer: Medicaid Other | Admitting: Diagnostic Neuroimaging

## 2022-07-28 ENCOUNTER — Encounter: Payer: Self-pay | Admitting: Diagnostic Neuroimaging

## 2022-09-05 ENCOUNTER — Other Ambulatory Visit: Payer: Self-pay | Admitting: Neurology

## 2022-09-09 DIAGNOSIS — Z0271 Encounter for disability determination: Secondary | ICD-10-CM

## 2022-10-29 ENCOUNTER — Ambulatory Visit: Payer: Medicaid Other | Admitting: Obstetrics & Gynecology

## 2022-10-29 ENCOUNTER — Encounter: Payer: Self-pay | Admitting: Obstetrics & Gynecology

## 2022-10-29 ENCOUNTER — Other Ambulatory Visit (HOSPITAL_COMMUNITY)
Admission: RE | Admit: 2022-10-29 | Discharge: 2022-10-29 | Disposition: A | Discharge status: Home / Self Care | Payer: Medicaid Other | Source: Ambulatory Visit | Attending: Obstetrics & Gynecology | Admitting: Obstetrics & Gynecology

## 2022-10-29 VITALS — BP 118/76 | HR 76 | Ht 62.0 in | Wt 161.0 lb

## 2022-10-29 DIAGNOSIS — Z01419 Encounter for gynecological examination (general) (routine) without abnormal findings: Secondary | ICD-10-CM

## 2022-10-29 DIAGNOSIS — N951 Menopausal and female climacteric states: Secondary | ICD-10-CM | POA: Insufficient documentation

## 2022-10-29 DIAGNOSIS — R102 Pelvic and perineal pain unspecified side: Secondary | ICD-10-CM

## 2022-10-29 DIAGNOSIS — N643 Galactorrhea not associated with childbirth: Secondary | ICD-10-CM

## 2022-10-29 NOTE — Progress Notes (Signed)
Patient ID: Amanda Valentine, female   DOB: 07/15/1975, 47 y.o.   MRN: [id number]  Chief Complaint  Patient presents with   New Patient (Initial Visit)    HPI Amanda Valentine is a 47 y.o. female.  [id number] Patient's last menstrual period was 03/14/2022. Patient has very infrequent menses and VMS. For years she has reported bilateral nipple discharge and evaluation has been negative. PRL was normal. HPI  Past Medical History:  Diagnosis Date   Anxiety    Bipolar 1 disorder    Chronic bronchitis    Depression    Diabetes mellitus without complication    diet controlled   Epilepsy    Hypertension    Kidney calculi     Past Surgical History:  Procedure Laterality Date   TUBAL LIGATION      Family History  Problem Relation Age of Onset   Seizures Maternal Grandmother    Diabetes Mellitus II Maternal Grandfather    Hypertension Other    CAD Other    Cancer Other    Kidney failure Other     Social History Social History   Tobacco Use   Smoking status: Every Day    Packs/day: 1    Types: Cigarettes   Smokeless tobacco: Never  Vaping Use   Vaping Use: Never used  Substance Use Topics   Alcohol use: Yes    Comment: occ   Drug use: Not Currently    Types: Marijuana    Allergies  Allergen Reactions   Acyclovir And Related    Cortisone Itching    Self reported   Depakote [Valproic Acid]    Penicillins Other (See Comments)    Has patient had a PCN reaction causing immediate rash, facial/tongue/throat swelling, SOB or lightheadedness with hypotension: No Has patient had a PCN reaction causing severe rash involving mucus membranes or skin necrosis: No Has patient had a PCN reaction that required hospitalization No Has patient had a PCN reaction occurring within the last 10 years: No If all of the above answers are "NO", then may proceed with Cephalosporin use.   Orange Juice [Orange Oil] Rash    Current Outpatient Medications  Medication Sig Dispense Refill    ALPRAZolam (XANAX) 1 MG tablet Take 1 tablet (1 mg total) by mouth 2 (two) times daily. For anxiety 60 tablet 2   desvenlafaxine (PRISTIQ) 50 MG 24 hr tablet Take 50 mg by mouth daily.     gabapentin (NEURONTIN) 300 MG capsule TAKE 1 CAPSULE BY MOUTH TWICE A DAY 60 capsule 1   megestrol (MEGACE) 40 MG tablet Take 1 tablet (40 mg total) by mouth as directed. Take one tablet three times a day for 3 days, then one tablet twice daily for 3 days, then one tablet daily 90 tablet 3   SUBOXONE 8-2 MG FILM Place 1 Film under the tongue 2 (two) times daily.     lidocaine (LIDODERM) 5 % Place 1 patch onto the skin daily. Remove & Discard patch within 12 hours or as directed by MD (Patient not taking: Reported on 10/29/2022) 30 patch 0   methocarbamol (ROBAXIN) 500 MG tablet Take 1 tablet (500 mg total) by mouth 2 (two) times daily. (Patient not taking: Reported on 10/29/2022) 10 tablet 0   NON FORMULARY Suboxone 8 mg     No current facility-administered medications for this visit.    Review of Systems Review of Systems  Constitutional: Negative.   Respiratory: Negative.    Cardiovascular: Negative.   Endocrine:  Nipple discharge and VMS  Genitourinary:  Negative for menstrual problem and pelvic pain.  Neurological: Negative.   Psychiatric/Behavioral: Negative.      Blood pressure 118/76, pulse 76, height  (1.575 m), weight 161 lb (73 kg), last menstrual period 03/14/2022.  Physical Exam Physical Exam Vitals and nursing note reviewed. Exam conducted with a chaperone present.  Constitutional:      Appearance: Normal appearance.  HENT:     Head: Normocephalic and atraumatic.  Cardiovascular:     Rate and Rhythm: Normal rate.  Pulmonary:     Effort: Pulmonary effort is normal.  Chest:  Breasts:    Right: Normal.     Left: Normal.  Abdominal:     General: Abdomen is flat.     Palpations: Abdomen is soft.  Genitourinary:    General: Normal vulva.     Exam position: Lithotomy  position.     Vagina: Normal.     Cervix: Normal.     Uterus: Normal.      Adnexa: Right adnexa normal and left adnexa normal.  Musculoskeletal:        General: Normal range of motion.  Skin:    General: Skin is warm and dry.  Neurological:     General: No focal deficit present.  Psychiatric:        Mood and Affect: Mood normal.        Behavior: Behavior normal.     Data Reviewed Last pap and labs  Assessment Well woman exam with routine gynecological exam - Plan: Cytology - PAP( [place]), Cervicovaginal ancillary only( [place]), HIV Antibody (routine testing w rflx), Hepatitis B surface antigen, RPR, Hepatitis C antibody, MM Digital Screening, US PELVIC COMPLETE WITH TRANSVAGINAL, Prolactin, Follicle stimulating hormone  Galactorrhea  Pelvic pain  Perimenopausal - Plan: Cytology - PAP( [place]), Cervicovaginal ancillary only( [place]), HIV Antibody (routine testing w rflx), Hepatitis B surface antigen, RPR, Hepatitis C antibody, MM Digital Screening, US PELVIC COMPLETE WITH TRANSVAGINAL, Prolactin, Follicle stimulating hormone   Plan F/u on labs sent. Mammogram and US pelvic    Scheryl Darter 10/29/2022, 2:07 PM

## 2022-10-29 NOTE — Progress Notes (Signed)
Pt states she only has about 3-4 cycles a year. Pt complains of heavy cycles when she is on cycle with severe pain. Pt request full testing today, pt also needs mammogram.

## 2022-10-30 LAB — HEPATITIS B SURFACE ANTIGEN: Hepatitis B Surface Ag: NEGATIVE

## 2022-10-30 LAB — CERVICOVAGINAL ANCILLARY ONLY
Chlamydia: NEGATIVE
Comment: NEGATIVE
Comment: NORMAL
Neisseria Gonorrhea: NEGATIVE

## 2022-10-30 LAB — HIV ANTIBODY (ROUTINE TESTING W REFLEX): HIV Screen 4th Generation wRfx: NONREACTIVE

## 2022-10-30 LAB — PROLACTIN: Prolactin: 4.2 ng/mL — ABNORMAL LOW (ref 4.8–33.4)

## 2022-10-30 LAB — RPR: RPR Ser Ql: NONREACTIVE

## 2022-10-30 LAB — HEPATITIS C ANTIBODY: Hep C Virus Ab: NONREACTIVE

## 2022-10-30 LAB — FOLLICLE STIMULATING HORMONE: FSH: 9.4 m[IU]/mL

## 2022-11-03 ENCOUNTER — Other Ambulatory Visit: Payer: Self-pay | Admitting: Obstetrics & Gynecology

## 2022-11-03 ENCOUNTER — Ambulatory Visit: Payer: Medicaid Other | Admitting: Cardiology

## 2022-11-03 ENCOUNTER — Encounter: Payer: Self-pay | Admitting: Cardiology

## 2022-11-03 VITALS — BP 150/75 | HR 81 | Resp 16 | Ht 62.0 in | Wt 159.0 lb

## 2022-11-03 DIAGNOSIS — R030 Elevated blood-pressure reading, without diagnosis of hypertension: Secondary | ICD-10-CM | POA: Insufficient documentation

## 2022-11-03 DIAGNOSIS — N6452 Nipple discharge: Secondary | ICD-10-CM

## 2022-11-03 DIAGNOSIS — F17220 Nicotine dependence, chewing tobacco, uncomplicated: Secondary | ICD-10-CM

## 2022-11-03 DIAGNOSIS — F1010 Alcohol abuse, uncomplicated: Secondary | ICD-10-CM

## 2022-11-03 DIAGNOSIS — N644 Mastodynia: Secondary | ICD-10-CM

## 2022-11-03 DIAGNOSIS — R072 Precordial pain: Secondary | ICD-10-CM

## 2022-11-03 LAB — CYTOLOGY - PAP
Comment: NEGATIVE
Diagnosis: NEGATIVE
High risk HPV: NEGATIVE

## 2022-11-03 NOTE — Progress Notes (Addendum)
Patient referred by Carmel [place], NP for chest pain  Subjective:   Amanda Valentine, female    DOB: 30-Oct-1975, 47 y.o.   MRN: [id number]   Chief Complaint  Patient presents with   Chest Pain   Shortness of Breath   New Patient (Initial Visit)     HPI  47 y.o. Caucasian female with anxiety, depression, nicotine dependence, heavy alcohol abuse, h/o seizures, family history of CAD, presents with chest pain.  Patient is currently home, lives with her 39 year old son.  She has 3 other older children.  She is unable to drive due to her seizures, last was 2 and half weeks ago.  She reports left-sided sharp chest pains, lasting for few seconds, not associated with exertion.  She does not have any other exertional dyspnea, orthopnea, PND, leg edema symptoms.  She is worried about "small regurgitating valve" noted on echocardiogram several years ago.  Patient smokes 1-1/2 pack cigarettes daily.  She also drinks anywhere from 6-18 beers almost daily.  Her previous attempts of quitting have "not gone well".  She feels that she has "nothing else to do".  She has had several deaths in her family in the past year or so, which have made things very difficult for her.  She has depression, but denies suicidal thoughts  Patient reportedly has neurologist at [facility], with next follow-up scheduled in August.  She tells me that she has been off medications for seizures until then.   Past Medical History:  Diagnosis Date   Anxiety    Bipolar 1 disorder    Chronic bronchitis    Depression    Diabetes mellitus without complication    diet controlled   Epilepsy    Hypertension    Kidney calculi      Past Surgical History:  Procedure Laterality Date   TUBAL LIGATION       Social History   Tobacco Use  Smoking Status Every Day   Packs/day: 1   Types: Cigarettes  Smokeless Tobacco Never    Social History   Substance and Sexual Activity  Alcohol Use Yes   Comment:  occ     Family History  Problem Relation Age of Onset   Heart failure Mother    Cancer Mother    Kidney failure Mother    Seizures Maternal Grandmother    Diabetes Mellitus II Maternal Grandfather    Hypertension Other    CAD Other    Cancer Other    Kidney failure Other       Current Outpatient Medications:    ALPRAZolam (XANAX) 1 MG tablet, Take 1 tablet (1 mg total) by mouth 2 (two) times daily. For anxiety, Disp: 60 tablet, Rfl: 2   desvenlafaxine (PRISTIQ) 50 MG 24 hr tablet, Take 50 mg by mouth daily., Disp: , Rfl:    gabapentin (NEURONTIN) 300 MG capsule, TAKE 1 CAPSULE BY MOUTH TWICE A DAY, Disp: 60 capsule, Rfl: 1   megestrol (MEGACE) 40 MG tablet, Take 1 tablet (40 mg total) by mouth as directed. Take one tablet three times a day for 3 days, then one tablet twice daily for 3 days, then one tablet daily, Disp: 90 tablet, Rfl: 3   NON FORMULARY, Suboxone 8 mg, Disp: , Rfl:    SUBOXONE 8-2 MG FILM, Place 1 Film under the tongue 2 (two) times daily., Disp: , Rfl:    traZODone (DESYREL) 50 MG tablet, Take 50 mg by mouth at bedtime., Disp: , Rfl:  Cardiovascular and other pertinent studies:  Reviewed external labs and tests, independently interpreted  EKG 11/03/2022: Sinus rhythm 81bpm  Anteroseptal infarct     Recent labs: 05/29/2020: Glucose 106, BUN/Cr 15/0.9. EGFR >60. Na/K 137/3.8. Rest of the CMP normal H/H 13/37. MCV 100. Platelets 284 HbA1C NA Lipid panl NA  01/2018: TSH 1.5 normal   Review of Systems  Cardiovascular:  Positive for chest pain. Negative for dyspnea on exertion, leg swelling, palpitations and syncope.         Vitals:   11/03/22 0841  BP: (!) 150/75  Pulse: 81  Resp: 16  SpO2: 98%     Body mass index is 29.08 kg/m. Filed Weights   11/03/22 0841  Weight: 159 lb (72.1 kg)     Objective:   Physical Exam Vitals and nursing note reviewed.  Constitutional:      General: She is not in acute distress. Neck:      Vascular: No JVD.  Cardiovascular:     Rate and Rhythm: Normal rate and regular rhythm.     Heart sounds: Normal heart sounds. No murmur heard. Pulmonary:     Effort: Pulmonary effort is normal.     Breath sounds: Normal breath sounds. No wheezing or rales.  Musculoskeletal:     Right lower leg: No edema.     Left lower leg: No edema.            Visit diagnoses:   ICD-10-CM   1. Precordial pain  R07.2 EKG 12-Lead    PCV CARDIAC STRESS TEST    PCV ECHOCARDIOGRAM COMPLETE    CT CARDIAC SCORING (SELF PAY ONLY)    2. Chewing tobacco nicotine dependence without complication  F17.220     3. Alcohol abuse  F10.10     4. Elevated blood pressure reading without diagnosis of hypertension  R03.0        Orders Placed This Encounter  Procedures   EKG 12-Lead     Medication changes this visit: Medications Discontinued During This Encounter  Medication Reason   lidocaine (LIDODERM) 5 %    methocarbamol (ROBAXIN) 500 MG tablet     No orders of the defined types were placed in this encounter.    Assessment & Recommendations:   47 y.o. Caucasian female with anxiety, depression, nicotine dependence, heavy alcohol abuse, h/o seizures, family history of CAD, presents with chest pain.  Chest pain: Likely noncardiac.  Recommend exercise treadmill stress test, echocardiogram, calcium score scan.  Overall suspicion for cardiac etiology is low.  Elevated blood pressure without diagnosis of hypertension: Blood pressure usually normal.  Patient was emotional talking about her issues below today, which could have contributed.  Monitor for now.  Nicotine dependence: Tobacco cessation counseling:  - Currently smoking 1.5 packs/day   - Patient was informed of the dangers of tobacco abuse including stroke, cancer, and MI, as well as benefits of tobacco cessation. - Patient is willing to quit at this time. - Approximately 5 mins were spent counseling patient cessation techniques. We  discussed various methods to help quit smoking, including deciding on a date to quit, joining a support group, pharmacological agents.  - I will reassess her progress at the next follow-up visit   I had a long conversation with the patient regarding her nicotine dependence, and alcohol abuse.  With MCV of 100, and tingling numbness in her arms and hands, she could very likely have neuropathy.  I offered patient several helpline numbers including 1 800 quit now, alcoholic  Anonymous, as well as suicide prevention helpline 988.  I do think alcohol and nicotine dependence, depression, are her biggest medical issues.  Patient appreciated the discussion.  I will see her on as-needed basis, unless significant abnormalities found on above cardiac testing    Thank you for referring the patient to [place]. Please feel free to contact with any questions.   Elder Negus, MD Pager: [phone] Office: [phone]

## 2022-11-05 ENCOUNTER — Ambulatory Visit (HOSPITAL_COMMUNITY): Admission: RE | Admit: 2022-11-05 | Payer: Medicaid Other | Source: Ambulatory Visit

## 2022-11-12 ENCOUNTER — Other Ambulatory Visit: Payer: Medicaid Other

## 2022-11-12 ENCOUNTER — Inpatient Hospital Stay: Admission: RE | Admit: 2022-11-12 | Payer: Medicaid Other | Source: Ambulatory Visit

## 2022-11-17 ENCOUNTER — Other Ambulatory Visit: Payer: Medicaid Other

## 2022-11-25 ENCOUNTER — Other Ambulatory Visit: Payer: Self-pay | Admitting: Neurology

## 2022-11-25 NOTE — Telephone Encounter (Signed)
You can send it to me.

## 2022-12-11 ENCOUNTER — Other Ambulatory Visit: Payer: Medicaid Other

## 2022-12-18 ENCOUNTER — Other Ambulatory Visit: Payer: Medicaid Other

## 2023-01-14 ENCOUNTER — Other Ambulatory Visit: Payer: Medicaid Other

## 2023-01-17 ENCOUNTER — Other Ambulatory Visit: Payer: Self-pay | Admitting: Neurology

## 2023-01-27 ENCOUNTER — Telehealth: Payer: Self-pay | Admitting: Cardiology

## 2023-02-18 NOTE — Telephone Encounter (Signed)
lvm to r/s echo & gxt

## 2023-03-09 ENCOUNTER — Ambulatory Visit: Payer: Medicaid Other | Admitting: Diagnostic Neuroimaging

## 2023-05-18 ENCOUNTER — Other Ambulatory Visit: Payer: Self-pay | Admitting: Neurology

## 2023-05-27 ENCOUNTER — Other Ambulatory Visit: Payer: Self-pay | Admitting: Neurology

## 2023-06-10 ENCOUNTER — Other Ambulatory Visit: Payer: Self-pay | Admitting: Neurology

## 2023-06-17 ENCOUNTER — Emergency Department (HOSPITAL_COMMUNITY)
Admission: EM | Admit: 2023-06-17 | Discharge: 2023-06-17 | Disposition: A | Discharge status: Home / Self Care | Payer: Medicaid Other | Attending: Emergency Medicine | Admitting: Emergency Medicine

## 2023-06-17 ENCOUNTER — Emergency Department (HOSPITAL_COMMUNITY): Payer: Medicaid Other

## 2023-06-17 ENCOUNTER — Encounter (HOSPITAL_COMMUNITY): Payer: Self-pay

## 2023-06-17 ENCOUNTER — Other Ambulatory Visit: Payer: Self-pay

## 2023-06-17 DIAGNOSIS — R000 Tachycardia, unspecified: Secondary | ICD-10-CM | POA: Diagnosis not present

## 2023-06-17 DIAGNOSIS — E119 Type 2 diabetes mellitus without complications: Secondary | ICD-10-CM | POA: Diagnosis not present

## 2023-06-17 DIAGNOSIS — R140 Abdominal distension (gaseous): Secondary | ICD-10-CM | POA: Diagnosis present

## 2023-06-17 DIAGNOSIS — I10 Essential (primary) hypertension: Secondary | ICD-10-CM | POA: Diagnosis not present

## 2023-06-17 DIAGNOSIS — M25562 Pain in left knee: Secondary | ICD-10-CM | POA: Diagnosis not present

## 2023-06-17 DIAGNOSIS — R1032 Left lower quadrant pain: Secondary | ICD-10-CM | POA: Insufficient documentation

## 2023-06-17 DIAGNOSIS — R1012 Left upper quadrant pain: Secondary | ICD-10-CM | POA: Insufficient documentation

## 2023-06-17 LAB — COMPREHENSIVE METABOLIC PANEL
ALT: 18 U/L (ref 0–44)
AST: 21 U/L (ref 15–41)
Albumin: 4.4 g/dL (ref 3.5–5.0)
Alkaline Phosphatase: 66 U/L (ref 38–126)
Anion gap: 10 (ref 5–15)
BUN: 6 mg/dL (ref 6–20)
CO2: 20 mmol/L — ABNORMAL LOW (ref 22–32)
Calcium: 9.4 mg/dL (ref 8.9–10.3)
Chloride: 109 mmol/L (ref 98–111)
Creatinine, Ser: 0.79 mg/dL (ref 0.44–1.00)
GFR, Estimated: >60 mL/min (ref >60)
Glucose, Bld: 101 mg/dL — ABNORMAL HIGH (ref 70–99)
Potassium: 3.7 mmol/L (ref 3.5–5.1)
Sodium: 139 mmol/L (ref 135–145)
Total Bilirubin: 0.5 mg/dL (ref <1.2)
Total Protein: 7 g/dL (ref 6.5–8.1)

## 2023-06-17 LAB — HCG, SERUM, QUALITATIVE: Preg, Serum: NEGATIVE

## 2023-06-17 LAB — URINALYSIS, ROUTINE W REFLEX MICROSCOPIC
Bilirubin Urine: NEGATIVE
Glucose, UA: NEGATIVE mg/dL
Ketones, ur: NEGATIVE mg/dL
Leukocytes,Ua: NEGATIVE
Nitrite: NEGATIVE
Protein, ur: NEGATIVE mg/dL
Specific Gravity, Urine: 1.005 (ref 1.005–1.030)
pH: 6 (ref 5.0–8.0)

## 2023-06-17 LAB — CBC
HCT: 39.4 % (ref 36.0–46.0)
Hemoglobin: 13.6 g/dL (ref 12.0–15.0)
MCH: 35 pg — ABNORMAL HIGH (ref 26.0–34.0)
MCHC: 34.5 g/dL (ref 30.0–36.0)
MCV: 101.3 fL — ABNORMAL HIGH (ref 80.0–100.0)
Platelets: 256 10*3/uL (ref 150–400)
RBC: 3.89 MIL/uL (ref 3.87–5.11)
RDW: 13.2 % (ref 11.5–15.5)
WBC: 7.6 10*3/uL (ref 4.0–10.5)
nRBC: 0 % (ref 0.0–0.2)

## 2023-06-17 LAB — LIPASE, BLOOD: Lipase: 27 U/L (ref 11–51)

## 2023-06-17 MED ORDER — ACETAMINOPHEN 500 MG PO TABS
1000.0000 mg | ORAL_TABLET | Freq: Once | ORAL | Status: AC
  Administered 2023-06-17: 1000 mg via ORAL
  Filled 2023-06-17: qty 2

## 2023-06-17 MED ORDER — IOHEXOL 300 MG/ML  SOLN
100.0000 mL | Freq: Once | INTRAMUSCULAR | Status: AC | PRN
  Administered 2023-06-17: 100 mL via INTRAVENOUS

## 2023-06-17 MED ORDER — MORPHINE SULFATE (PF) 4 MG/ML IV SOLN
4.0000 mg | Freq: Once | INTRAVENOUS | Status: AC
  Administered 2023-06-17: 4 mg via INTRAVENOUS
  Filled 2023-06-17: qty 1

## 2023-06-17 MED ORDER — DICYCLOMINE HCL 20 MG PO TABS: 20.0000 mg | ORAL_TABLET | Freq: Two times a day (BID) | ORAL | 0 refills | Status: DC

## 2023-06-17 NOTE — Discharge Instructions (Addendum)
Fortunately no concerning finding were noted on today's exam.  Take bentyl as needed for bloating and follow up closely with your doctor for further care.  Continue with your Suboxone as needed for pain. Follow up with your neurologist for further management of your recurrent seizures

## 2023-06-17 NOTE — ED Notes (Signed)
Patient had seizure like episode in triage, with visible shaking noted. However, patient able to answer questions directly after episode ended. Not post ictal in triage. Hyperventilating.

## 2023-06-17 NOTE — ED Notes (Addendum)
Pt had a seizure like episode for approx 10 seconds, when done, immediately said "I gotta pee". Pt placed on bedpan

## 2023-06-17 NOTE — ED Triage Notes (Addendum)
Pt says she fell Saturday. Hit her head and her abdominal area is hurting in multiple locations. Belly is swollen and tight.

## 2023-06-17 NOTE — ED Provider Notes (Signed)
[place] EMERGENCY DEPARTMENT AT [facility] Provider Note   CSN: [id number] Arrival date & time: 06/17/23  1137     History  Chief Complaint  Patient presents with   Abdominal Pain   Bloated    Amanda Valentine is a 47 y.o. female.  The history is provided by the patient and medical records. No language interpreter was used.  Abdominal Pain    47 year old female significant history of bipolar, diabetes, hypertension, seizures presenting for evaluation abdominal pain. Patient presents to the emergency department with a chief complaint of L sided abdominal pain that has been going on for the last week, now an 11/10 pain that alternates between sharp and achy pains. She is a poor historian throughout the interview process. She also reports associated vomiting, which is sometimes green and sometimes dark brown in color. Last bowel movement was yesterday, she describes it as a couple small pebbles when she used the restroom. She also reports intermittent diarrhea, hematochezia, dysuria, hematuria, and urinary incontinence that has been happening on and off for the past year. She does smoke 2 packs of ciagrettes a day, drinks 2-3 bootleggers or locos daily and smokes marijuana on a daily basis.   Home Medications Prior to Admission medications   Medication Sig Start Date End Date Taking? Authorizing Provider  ALPRAZolam Prudy Feeler) 1 MG tablet Take 1 tablet (1 mg total) by mouth 2 (two) times daily. For anxiety 01/11/18   Brock Bad, MD  desvenlafaxine (PRISTIQ) 50 MG 24 hr tablet Take 50 mg by mouth daily.    [provider]  gabapentin (NEURONTIN) 300 MG capsule TAKE 1 CAPSULE BY MOUTH TWICE A DAY 06/12/23   Windell Norfolk, MD  megestrol (MEGACE) 40 MG tablet Take 1 tablet (40 mg total) by mouth as directed. Take one tablet three times a day for 3 days, then one tablet twice daily for 3 days, then one tablet daily 12/10/17   Katrinka Blazing, [place], CNM  NON FORMULARY Suboxone  8 mg    [provider]  SUBOXONE 8-2 MG FILM Place 1 Film under the tongue 2 (two) times daily. 12/18/21   [provider]  traZODone (DESYREL) 50 MG tablet Take 50 mg by mouth at bedtime. 10/31/22   [provider]      Allergies    Acyclovir and related, Cortisone, Depakote [valproic acid], Penicillins, and Orange juice [orange oil]    Review of Systems   Review of Systems  Gastrointestinal:  Positive for abdominal pain.  All other systems reviewed and are negative.   Physical Exam Updated Vital Signs BP (!) 179/144   Pulse (!) 124   Resp (!) 22   Ht 5\' 2"  (1.575 m)   Wt 81.6 kg   SpO2 96%   BMI 32.92 kg/m  Physical Exam Vitals and nursing note reviewed.  Constitutional:      General: She is not in acute distress.    Appearance: She is well-developed. She is obese.  HENT:     Head: Normocephalic and atraumatic.  Eyes:     Conjunctiva/sclera: Conjunctivae normal.  Cardiovascular:     Rate and Rhythm: Tachycardia present.  Pulmonary:     Effort: Pulmonary effort is normal.     Breath sounds: No wheezing, rhonchi or rales.  Chest:     Chest wall: No tenderness.  Abdominal:     General: Bowel sounds are normal.     Tenderness: There is abdominal tenderness in the left upper quadrant and  left lower quadrant.     Hernia: No hernia is present.  Musculoskeletal:        General: Tenderness (L knee: ttp anterior knee without bruising or deformity) present.     Cervical back: Neck supple.  Skin:    Findings: No rash.  Neurological:     Mental Status: She is alert.  Psychiatric:        Mood and Affect: Mood normal.     ED Results / Procedures / Treatments   Labs (all labs ordered are listed, but only abnormal results are displayed) Labs Reviewed  COMPREHENSIVE METABOLIC PANEL - Abnormal; Notable for the following components:      Result Value   CO2 20 (*)    Glucose, Bld 101 (*)    All other components within normal limits  CBC -  Abnormal; Notable for the following components:   MCV 101.3 (*)    MCH 35.0 (*)    All other components within normal limits  URINALYSIS, ROUTINE W REFLEX MICROSCOPIC - Abnormal; Notable for the following components:   Color, Urine STRAW (*)    APPearance HAZY (*)    Hgb urine dipstick SMALL (*)    Bacteria, UA RARE (*)    All other components within normal limits  LIPASE, BLOOD  HCG, SERUM, QUALITATIVE    EKG None ED ECG REPORT   Date: 06/17/2023  Rate: 99  Rhythm: sinus tachycardia  QRS Axis: normal  Intervals: normal  ST/T Wave abnormalities: normal  Conduction Disutrbances:none  Narrative Interpretation:   Old EKG Reviewed: unchanged  I have personally reviewed the EKG tracing and agree with the computerized printout as noted.   Radiology CT ABDOMEN PELVIS W CONTRAST  Result Date: 06/17/2023 CLINICAL DATA:  Left upper quadrant abdominal pain. EXAM: CT ABDOMEN AND PELVIS WITH CONTRAST TECHNIQUE: Multidetector CT imaging of the abdomen and pelvis was performed using the standard protocol following bolus administration of intravenous contrast. RADIATION DOSE REDUCTION: This exam was performed according to the departmental dose-optimization program which includes automated exposure control, adjustment of the mA and/or kV according to patient size and/or use of iterative reconstruction technique. CONTRAST:  OMNIPAQUE IOHEXOL 300 MG/ML  SOLN COMPARISON:  September 16, 2017. FINDINGS: Lower chest: No acute abnormality. Hepatobiliary: No focal liver abnormality is seen. No gallstones, gallbladder wall thickening, or biliary dilatation. Pancreas: Unremarkable. No pancreatic ductal dilatation or surrounding inflammatory changes. Spleen: Normal in size without focal abnormality. Adrenals/Urinary Tract: Adrenal glands are unremarkable. Kidneys are normal, without renal calculi, focal lesion, or hydronephrosis. Bladder is unremarkable. Stomach/Bowel: Stomach is within normal limits.  Appendix appears normal. No evidence of bowel wall thickening, distention, or inflammatory changes. Vascular/Lymphatic: No significant vascular findings are present. No enlarged abdominal or pelvic lymph nodes. Reproductive: Uterus and bilateral adnexa are unremarkable. Other: No abdominal wall hernia or abnormality. No abdominopelvic ascites. Musculoskeletal: No acute or significant osseous findings. IMPRESSION: No definite abnormality seen in the abdomen or pelvis. Electronically Signed   By: Lupita Raider M.D.   On: 06/17/2023 14:42   DG Knee 2 Views Left  Result Date: 06/17/2023 CLINICAL DATA:  Larey Seat 5 days ago.  Left knee pain. EXAM: LEFT KNEE - 1-2 VIEW COMPARISON:  None Available. FINDINGS: No evidence of fracture, dislocation, or joint effusion. No evidence of arthropathy or other focal bone abnormality. Soft tissues are unremarkable. IMPRESSION: Negative. Electronically Signed   By: Amie Portland M.D.   On: 06/17/2023 13:38    Procedures Procedures    Medications Ordered  in ED Medications  morphine (PF) 4 MG/ML injection 4 mg (4 mg Intravenous Given 06/17/23 1257)  iohexol (OMNIPAQUE) 300 MG/ML solution 100 mL (100 mLs Intravenous Contrast Given 06/17/23 1328)  acetaminophen (TYLENOL) tablet 1,000 mg (1,000 mg Oral Given 06/17/23 1351)    ED Course/ Medical Decision Making/ A&P                                 Medical Decision Making Amount and/or Complexity of Data Reviewed Labs: ordered. Radiology: ordered.  Risk OTC drugs. Prescription drug management.   BP 113/88   Pulse 70   Temp 98.5 F (36.9 C)   Resp 19   Ht 5\' 2"  (1.575 m)   Wt 81.6 kg   SpO2 96%   BMI 32.92 kg/m   69:88 PM 47 year old female with history of bipolar, diabetes, depression, hypertension, anxiety, chronic bronchitis, polysubstance use here with multiple complaints.  Patient endorsed recurrent abdominal pain ongoing for months to years but worsened in the past several weeks.  Pain is sharp and  crampy mostly to her left side of abdomen.  She endorses feeling bloatedness, and her last bowel movement was yesterday and it was minimal.  She also states she fell a few days ago when her knees gave out.  She complaining of pain to both knees left greater than right.  She mention she has had multiple episodes of seizures within the past week.  She is taking the medication.  She complains of trouble breathing from her bronchitis.  She mention her mother recently passed away and "I need to get my health in order".  On exam, patient is moaning appears uncomfortable.  Heart with mild tachycardia, lungs otherwise clear chest nontender abdomen with tenderness to left upper quadrant and left lower quadrant no bruising no ecchymosis noted.  Tenderness to left anterior knee without any deformity or bruising noted.  She is moving all 4 extremities equally.  No signs of head trauma.  -Labs ordered, independently viewed and interpreted by me.  Labs remarkable for reassuring vaues -The patient was maintained on a cardiac monitor.  I personally viewed and interpreted the cardiac monitored which showed an underlying rhythm of: NSR -Imaging independently viewed and interpreted by me and I agree with radiologist's interpretation.  Result remarkable for abd/pelvis Ct unremarkable.  Xray L knee reassuring -This patient presents to the ED for concern of abd pain, this involves an extensive number of treatment options, and is a complaint that carries with it a high risk of complications and morbidity.  The differential diagnosis includes diverticulitis, colitis, appendicitis, pancreatitis, kidney contusion, splenic lac, gastritis, gerd, fx, dislocation -Co morbidities that complicate the patient evaluation includes bipolar, seizure, anxiety -Treatment includes morphine, tylenol,  -Reevaluation of the patient after these medicines showed that the patient improved -PCP office notes or outside notes reviewed -Escalation to  admission/observation considered: patients feels much better, is comfortable with discharge, and will follow up with PCP -Prescription medication considered, patient comfortable with bentyl -Social Determinant of Health considered which includes tobacco use         Final Clinical Impression(s) / ED Diagnoses Final diagnoses:  Abdominal bloating    Rx / DC Orders ED Discharge Orders          Ordered    dicyclomine (BENTYL) 20 MG tablet  2 times daily        06/17/23 1529  Fayrene Helper, PA-C 06/17/23 1533    Gerhard Munch, MD 06/17/23 [phone]

## 2023-06-17 NOTE — ED Notes (Signed)
Patient transported to CT 

## 2023-06-23 ENCOUNTER — Ambulatory Visit: Payer: Medicaid Other | Admitting: Nurse Practitioner

## 2023-06-24 ENCOUNTER — Encounter: Payer: Self-pay | Admitting: Nurse Practitioner

## 2023-07-18 ENCOUNTER — Other Ambulatory Visit: Payer: Self-pay

## 2023-07-18 ENCOUNTER — Encounter (HOSPITAL_COMMUNITY): Payer: Self-pay | Admitting: *Deleted

## 2023-07-18 ENCOUNTER — Emergency Department (HOSPITAL_COMMUNITY): Payer: Medicaid Other

## 2023-07-18 ENCOUNTER — Emergency Department (HOSPITAL_COMMUNITY)
Admission: EM | Admit: 2023-07-18 | Discharge: 2023-07-18 | Disposition: A | Discharge status: Home / Self Care | Payer: Medicaid Other | Attending: Emergency Medicine | Admitting: Emergency Medicine

## 2023-07-18 DIAGNOSIS — E119 Type 2 diabetes mellitus without complications: Secondary | ICD-10-CM | POA: Diagnosis not present

## 2023-07-18 DIAGNOSIS — R140 Abdominal distension (gaseous): Secondary | ICD-10-CM | POA: Diagnosis not present

## 2023-07-18 DIAGNOSIS — Z79899 Other long term (current) drug therapy: Secondary | ICD-10-CM | POA: Diagnosis not present

## 2023-07-18 DIAGNOSIS — I10 Essential (primary) hypertension: Secondary | ICD-10-CM | POA: Insufficient documentation

## 2023-07-18 DIAGNOSIS — F17210 Nicotine dependence, cigarettes, uncomplicated: Secondary | ICD-10-CM | POA: Insufficient documentation

## 2023-07-18 DIAGNOSIS — R110 Nausea: Secondary | ICD-10-CM | POA: Diagnosis not present

## 2023-07-18 DIAGNOSIS — R1084 Generalized abdominal pain: Secondary | ICD-10-CM | POA: Diagnosis present

## 2023-07-18 LAB — CBC WITH DIFFERENTIAL/PLATELET
Abs Immature Granulocytes: 0.02 10*3/uL (ref 0.00–0.07)
Basophils Absolute: 0.1 10*3/uL (ref 0.0–0.1)
Basophils Relative: 1 %
Eosinophils Absolute: 0.3 10*3/uL (ref 0.0–0.5)
Eosinophils Relative: 4 %
HCT: 38.9 % (ref 36.0–46.0)
Hemoglobin: 13.4 g/dL (ref 12.0–15.0)
Immature Granulocytes: 0 %
Lymphocytes Relative: 38 %
Lymphs Abs: 3.2 10*3/uL (ref 0.7–4.0)
MCH: 34.9 pg — ABNORMAL HIGH (ref 26.0–34.0)
MCHC: 34.4 g/dL (ref 30.0–36.0)
MCV: 101.3 fL — ABNORMAL HIGH (ref 80.0–100.0)
Monocytes Absolute: 0.6 10*3/uL (ref 0.1–1.0)
Monocytes Relative: 7 %
Neutro Abs: 4.2 10*3/uL (ref 1.7–7.7)
Neutrophils Relative %: 50 %
Platelets: 327 10*3/uL (ref 150–400)
RBC: 3.84 MIL/uL — ABNORMAL LOW (ref 3.87–5.11)
RDW: 13.5 % (ref 11.5–15.5)
WBC: 8.4 10*3/uL (ref 4.0–10.5)
nRBC: 0 % (ref 0.0–0.2)

## 2023-07-18 LAB — URINALYSIS, W/ REFLEX TO CULTURE (INFECTION SUSPECTED)
Bilirubin Urine: NEGATIVE
Glucose, UA: NEGATIVE mg/dL
Ketones, ur: NEGATIVE mg/dL
Leukocytes,Ua: NEGATIVE
Nitrite: NEGATIVE
Protein, ur: NEGATIVE mg/dL
Specific Gravity, Urine: 1.005 (ref 1.005–1.030)
pH: 6 (ref 5.0–8.0)

## 2023-07-18 LAB — I-STAT CHEM 8, ED
BUN: 8 mg/dL (ref 6–20)
Calcium, Ion: 1.22 mmol/L (ref 1.15–1.40)
Chloride: 108 mmol/L (ref 98–111)
Creatinine, Ser: 0.8 mg/dL (ref 0.44–1.00)
Glucose, Bld: 112 mg/dL — ABNORMAL HIGH (ref 70–99)
HCT: 43 % (ref 36.0–46.0)
Hemoglobin: 14.6 g/dL (ref 12.0–15.0)
Potassium: 3.6 mmol/L (ref 3.5–5.1)
Sodium: 141 mmol/L (ref 135–145)
TCO2: 19 mmol/L — ABNORMAL LOW (ref 22–32)

## 2023-07-18 LAB — RAPID URINE DRUG SCREEN, HOSP PERFORMED
Amphetamines: NOT DETECTED
Barbiturates: NOT DETECTED
Benzodiazepines: POSITIVE — AB
Cocaine: NOT DETECTED
Opiates: NOT DETECTED
Tetrahydrocannabinol: POSITIVE — AB

## 2023-07-18 LAB — COMPREHENSIVE METABOLIC PANEL
ALT: 18 U/L (ref 0–44)
AST: 23 U/L (ref 15–41)
Albumin: 4.2 g/dL (ref 3.5–5.0)
Alkaline Phosphatase: 62 U/L (ref 38–126)
Anion gap: 9 (ref 5–15)
BUN: 10 mg/dL (ref 6–20)
CO2: 19 mmol/L — ABNORMAL LOW (ref 22–32)
Calcium: 9.4 mg/dL (ref 8.9–10.3)
Chloride: 109 mmol/L (ref 98–111)
Creatinine, Ser: 0.66 mg/dL (ref 0.44–1.00)
GFR, Estimated: >60 mL/min (ref >60)
Glucose, Bld: 118 mg/dL — ABNORMAL HIGH (ref 70–99)
Potassium: 3.5 mmol/L (ref 3.5–5.1)
Sodium: 137 mmol/L (ref 135–145)
Total Bilirubin: 0.6 mg/dL (ref 0.0–1.2)
Total Protein: 7.3 g/dL (ref 6.5–8.1)

## 2023-07-18 LAB — PROTIME-INR
INR: 1 (ref 0.8–1.2)
Prothrombin Time: 13.8 s (ref 11.4–15.2)

## 2023-07-18 LAB — LIPASE, BLOOD: Lipase: 39 U/L (ref 11–51)

## 2023-07-18 LAB — PREGNANCY, URINE: Preg Test, Ur: NEGATIVE

## 2023-07-18 LAB — ETHANOL: Alcohol, Ethyl (B): 95 mg/dL — ABNORMAL HIGH (ref <10)

## 2023-07-18 MED ORDER — IOHEXOL 300 MG/ML  SOLN
100.0000 mL | Freq: Once | INTRAMUSCULAR | Status: AC | PRN
Start: 2023-07-18 — End: 2023-07-18
  Administered 2023-07-18: 100 mL via INTRAVENOUS

## 2023-07-18 MED ORDER — DICYCLOMINE HCL 20 MG PO TABS: 20.0000 mg | ORAL_TABLET | Freq: Two times a day (BID) | ORAL | 0 refills | Status: AC

## 2023-07-18 MED ORDER — GABAPENTIN 300 MG PO CAPS
300.0000 mg | ORAL_CAPSULE | Freq: Once | ORAL | Status: AC
  Administered 2023-07-18: 300 mg via ORAL
  Filled 2023-07-18: qty 1

## 2023-07-18 MED ORDER — MORPHINE SULFATE (PF) 4 MG/ML IV SOLN
4.0000 mg | Freq: Once | INTRAVENOUS | Status: DC
Start: 2023-07-18 — End: 2023-07-19

## 2023-07-18 MED ORDER — ONDANSETRON HCL 4 MG/2ML IJ SOLN
4.0000 mg | Freq: Once | INTRAMUSCULAR | Status: AC
Start: 2023-07-18 — End: 2023-07-18
  Administered 2023-07-18: 4 mg via INTRAVENOUS
  Filled 2023-07-18: qty 2

## 2023-07-18 MED ORDER — SODIUM CHLORIDE 0.9 % IV BOLUS
1000.0000 mL | Freq: Once | INTRAVENOUS | Status: AC
  Administered 2023-07-18: 1000 mL via INTRAVENOUS

## 2023-07-18 NOTE — ED Provider Notes (Signed)
 [place] [facility]MENT AT [facility] Provider Note  CSN: [id number] Arrival date & time: 07/18/23 1714  Chief Complaint(s) Abdominal Pain  HPI Amanda Valentine is a 48 y.o. female history of reported seizure disorder on gabapentin , bipolar disorder presenting with abdominal pain.  She reports that she has had abdominal pain for the past month, went to the ER previously for this, without diagnosis.  She reports sensation of abdominal distention.  She reports intermittent nausea, no vomiting.  No diarrhea, no bloody stools.  No fevers or chills.  She reports she decided to come in today because she wanted answers.  Past Medical History Past Medical History:  Diagnosis Date   Anxiety    Bipolar 1 disorder (HCC)    Chronic bronchitis (HCC)    Depression    Diabetes mellitus without complication (HCC)    diet controlled   Epilepsy (HCC)    Hypertension    Kidney calculi    Patient Active Problem List   Diagnosis Date Noted   Precordial pain 11/03/2022   Alcohol abuse 11/03/2022   Chewing tobacco nicotine dependence without complication 11/03/2022   Elevated blood pressure reading without diagnosis of hypertension 11/03/2022   Seizure (HCC) 11/27/2015   Bipolar disorder (HCC) 11/27/2015   Diabetes mellitus type 2 in nonobese (HCC) 11/27/2015   GI bleed 11/27/2015   Seizures (HCC) 11/27/2015   Acute GI bleeding 11/27/2015   Home Medication(s) Prior to Admission medications   Medication Sig Start Date End Date Taking? Authorizing Provider  ALPRAZolam  (XANAX ) 1 MG tablet Take 1 tablet (1 mg total) by mouth 2 (two) times daily. For anxiety 01/11/18   Rudy Carlin LABOR, MD  desvenlafaxine (PRISTIQ) 50 MG 24 hr tablet Take 50 mg by mouth daily.    [provider]  dicyclomine  (BENTYL ) 20 MG tablet Take 1 tablet (20 mg total) by mouth 2 (two) times daily. 07/18/23   Francesca Elsie CROME, MD  gabapentin  (NEURONTIN ) 300 MG capsule TAKE 1 CAPSULE BY MOUTH TWICE A DAY  06/12/23   Gregg Lek, MD  megestrol  (MEGACE ) 40 MG tablet Take 1 tablet (40 mg total) by mouth as directed. Take one tablet three times a day for 3 days, then one tablet twice daily for 3 days, then one tablet daily 12/10/17   Claudene, [place] , CNM  NON FORMULARY Suboxone 8 mg    [provider]  SUBOXONE 8-2 MG FILM Place 1 Film under the tongue 2 (two) times daily. 12/18/21   [provider]  traZODone (DESYREL) 50 MG tablet Take 50 mg by mouth at bedtime. 10/31/22   [provider]                                                                                                                                    Past Surgical History Past Surgical History:  Procedure Laterality Date   TUBAL LIGATION     Family  History Family History  Problem Relation Age of Onset   Heart failure Mother    Cancer Mother    Kidney failure Mother    Seizures Maternal Grandmother    Diabetes Mellitus II Maternal Grandfather    Hypertension Other    CAD Other    Cancer Other    Kidney failure Other     Social History Social History   Tobacco Use   Smoking status: Every Day    Current packs/day: 2.00    Types: Cigarettes   Smokeless tobacco: Never  Vaping Use   Vaping status: Never Used  Substance Use Topics   Alcohol use: Yes    Comment: daily   Drug use: Yes    Types: Marijuana   Allergies Acyclovir and related, Cortisone, Depakote  [valproic  acid], Penicillins, and Orange juice [orange oil]  Review of Systems Review of Systems  All other systems reviewed and are negative.   Physical Exam Vital Signs  I have reviewed the triage vital signs BP (!) 120/96   Pulse 94   Resp 16   SpO2 96%  Physical Exam Vitals and nursing note reviewed.  Constitutional:      General: She is not in acute distress.    Appearance: She is well-developed. She is obese.  HENT:     Head: Normocephalic and atraumatic.     Mouth/Throat:     Mouth: Mucous membranes are moist.   Eyes:     Pupils: Pupils are equal, round, and reactive to light.  Cardiovascular:     Rate and Rhythm: Normal rate and regular rhythm.     Heart sounds: No murmur heard. Pulmonary:     Effort: Pulmonary effort is normal. No respiratory distress.     Breath sounds: Normal breath sounds.  Abdominal:     General: Abdomen is flat.     Palpations: Abdomen is soft.     Tenderness: There is no abdominal tenderness.  Musculoskeletal:        General: No tenderness.     Right lower leg: No edema.     Left lower leg: No edema.  Skin:    General: Skin is warm and dry.  Neurological:     General: No focal deficit present.     Mental Status: She is alert. Mental status is at baseline.  Psychiatric:        Mood and Affect: Mood normal.        Behavior: Behavior normal.     ED Results and Treatments Labs (all labs ordered are listed, but only abnormal results are displayed) Labs Reviewed  COMPREHENSIVE METABOLIC PANEL - Abnormal; Notable for the following components:      Result Value   CO2 19 (*)    Glucose, Bld 118 (*)    All other components within normal limits  CBC WITH DIFFERENTIAL/PLATELET - Abnormal; Notable for the following components:   RBC 3.84 (*)    MCV 101.3 (*)    MCH 34.9 (*)    All other components within normal limits  ETHANOL - Abnormal; Notable for the following components:   Alcohol, Ethyl (B) 95 (*)    All other components within normal limits  RAPID URINE DRUG SCREEN, HOSP PERFORMED - Abnormal; Notable for the following components:   Benzodiazepines POSITIVE (*)    Tetrahydrocannabinol POSITIVE (*)    All other components within normal limits  URINALYSIS, W/ REFLEX TO CULTURE (INFECTION SUSPECTED) - Abnormal; Notable for the following components:   Color, Urine STRAW (*)  Hgb urine dipstick SMALL (*)    Bacteria, UA RARE (*)    All other components within normal limits  I-STAT CHEM 8, ED - Abnormal; Notable for the following components:   Glucose,  Bld 112 (*)    TCO2 19 (*)    All other components within normal limits  PROTIME-INR  PREGNANCY, URINE  LIPASE, BLOOD                                                                                                                          Radiology CT ABDOMEN PELVIS W CONTRAST Result Date: 07/18/2023 CLINICAL DATA:  Abdominal pain, distention. EXAM: CT ABDOMEN AND PELVIS WITH CONTRAST TECHNIQUE: Multidetector CT imaging of the abdomen and pelvis was performed using the standard protocol following bolus administration of intravenous contrast. RADIATION DOSE REDUCTION: This exam was performed according to the departmental dose-optimization program which includes automated exposure control, adjustment of the mA and/or kV according to patient size and/or use of iterative reconstruction technique. CONTRAST:  OMNIPAQUE  IOHEXOL  300 MG/ML  SOLN COMPARISON:  06/17/2023 FINDINGS: Lower chest: No acute abnormality Hepatobiliary: No focal hepatic abnormality. Gallbladder unremarkable. Pancreas: No focal abnormality or ductal dilatation. Spleen: No focal abnormality.  Normal size. Adrenals/Urinary Tract: No adrenal abnormality. No focal renal abnormality. No stones or hydronephrosis. Urinary bladder is unremarkable. Stomach/Bowel: Normal appendix. Stomach, large and small bowel grossly unremarkable. Vascular/Lymphatic: No evidence of aneurysm or adenopathy. Reproductive: Uterus and adnexa unremarkable.  No mass. Other: No free fluid or free air. Musculoskeletal: No acute bony abnormality. IMPRESSION: No acute findings in the abdomen or pelvis. Electronically Signed   By: Franky Crease M.D.   On: 07/18/2023 19:55    Pertinent labs & imaging results that were available during my care of the patient were reviewed by me and considered in my medical decision making (see MDM for details).  Medications Ordered in ED Medications  morphine  (PF) 4 MG/ML injection 4 mg (4 mg Intravenous Not Given 07/18/23 2049)   gabapentin  (NEURONTIN ) capsule 300 mg (has no administration in time range)  ondansetron  (ZOFRAN ) injection 4 mg (4 mg Intravenous Given 07/18/23 1801)  sodium chloride  0.9 % bolus 1,000 mL (1,000 mLs Intravenous Bolus 07/18/23 1921)  iohexol  (OMNIPAQUE ) 300 MG/ML solution 100 mL (100 mLs Intravenous Contrast Given 07/18/23 1939)  Procedures Procedures  (including critical care time)  Medical Decision Making / ED Course   MDM:  48 year old female presenting to the emergency department with abdominal pain.  Patient overall well-appearing, physical exam unremarkable.  No abdominal tenderness.  Labs overall reassuring  CT abdomen obtained with no evidence of acute intra-abdominal process such as obstruction, perforation, volvulus, abscess, cholecystitis, pancreatitis.  No ascites or other process to suggest abdominal distention.  She has an obese abdomen on exam but no fluid wave or other process.  Recommended follow-up with her outpatient GI for further workup.  Patient did have 2 episodes of shaking activity while in the emergency department.  I witnessed these episodes.  She had no postictal episode, tongue biting or incontinence and they were very brief.  She does have a documented seizure disorder however she reports that she is supposed to be on gabapentin  but has not taken at the 4 days previously.  I recommended she follow up with her neurologist Dr. Gregg. Discussed seizure precautions        Additional history obtained: -Additional history obtained from spouse    Lab Tests: -I ordered, reviewed, and interpreted labs.   The pertinent results include:   Labs Reviewed  COMPREHENSIVE METABOLIC PANEL - Abnormal; Notable for the following components:      Result Value   CO2 19 (*)    Glucose, Bld 118 (*)    All other components within normal limits   CBC WITH DIFFERENTIAL/PLATELET - Abnormal; Notable for the following components:   RBC 3.84 (*)    MCV 101.3 (*)    MCH 34.9 (*)    All other components within normal limits  ETHANOL - Abnormal; Notable for the following components:   Alcohol, Ethyl (B) 95 (*)    All other components within normal limits  RAPID URINE DRUG SCREEN, HOSP PERFORMED - Abnormal; Notable for the following components:   Benzodiazepines POSITIVE (*)    Tetrahydrocannabinol POSITIVE (*)    All other components within normal limits  URINALYSIS, W/ REFLEX TO CULTURE (INFECTION SUSPECTED) - Abnormal; Notable for the following components:   Color, Urine STRAW (*)    Hgb urine dipstick SMALL (*)    Bacteria, UA RARE (*)    All other components within normal limits  I-STAT CHEM 8, ED - Abnormal; Notable for the following components:   Glucose, Bld 112 (*)    TCO2 19 (*)    All other components within normal limits  PROTIME-INR  PREGNANCY, URINE  LIPASE, BLOOD     Imaging Studies ordered: I ordered imaging studies including CT abdomen  On my interpretation imaging demonstrates no acute process I independently visualized and interpreted imaging. I agree with the radiologist interpretation   Medicines ordered and prescription drug management: Meds ordered this encounter  Medications   ondansetron  (ZOFRAN ) injection 4 mg   sodium chloride  0.9 % bolus 1,000 mL   morphine  (PF) 4 MG/ML injection 4 mg   iohexol  (OMNIPAQUE ) 300 MG/ML solution 100 mL   dicyclomine  (BENTYL ) 20 MG tablet    Sig: Take 1 tablet (20 mg total) by mouth 2 (two) times daily.    Dispense:  20 tablet    Refill:  0   gabapentin  (NEURONTIN ) capsule 300 mg    -I have reviewed the patients home medicines and have made adjustments as needed   Social Determinants of Health:  Diagnosis or treatment significantly limited by social determinants of health: obesity and polysubstance abuse   Reevaluation: After the interventions  noted above,  I reevaluated the patient and found that their symptoms have improved  Co morbidities that complicate the patient evaluation  Past Medical History:  Diagnosis Date   Anxiety    Bipolar 1 disorder (HCC)    Chronic bronchitis (HCC)    Depression    Diabetes mellitus without complication (HCC)    diet controlled   Epilepsy (HCC)    Hypertension    Kidney calculi       Dispostion: Disposition decision including need for hospitalization was considered, and patient discharged from emergency department.    Final Clinical Impression(s) / ED Diagnoses Final diagnoses:  Generalized abdominal pain     This chart was dictated using voice recognition software.  Despite best efforts to proofread,  errors can occur which can change the documentation meaning.    Francesca Elsie CROME, MD 07/18/23 2050

## 2023-07-18 NOTE — ED Notes (Signed)
 As charted under neurological assessment at 1819, patient had a witnessed episode of seizure-like activity. This occurred when she walked out of the bathroom. Her gait was unsteady and she stated I'm getting really dizzy. Her knees began to buckle and she was held underneath her arms to prevent falling. Seizure-like activity began and patient was assisted to the ground into a left lateral position. MD alerted. Head was protected from injury. Airway was monitored and no vomiting was observed. Pt's breathing paused for the 30-45 second period, but immediately returned after seizure-like activity ceased. Patient began crying after seizure-like activity ceased and continued crying for the next 60 seconds. She was assessed for injury at this time, with no obvious signs of injury noted. She did not respond to anyone while she was crying. When she stopped crying, she was assisted to a wheelchair and brought back to ER room and vital signs were assessed. Patient denied any new pain or complaints.

## 2023-07-18 NOTE — ED Triage Notes (Addendum)
 Pt with c/o abd pain and distention for past 2 days, while in waiting room pt began to have seizure activity (pt found in the floor) for about a minute,  EDP witnessed some in [place]. Pt states she has not had seizure medication for past 4 days.

## 2023-07-18 NOTE — Discharge Instructions (Addendum)
 We evaluated you for your abdominal pain.  Your CT scans were reassuring and did not show any dangerous findings.  Your laboratory test were also reassuring.  I would recommend following up with your primary doctor and your GI doctor.  If you would like to follow-up with a different GI doctor, you can follow-up with Dr. Cindie.    Per [place]  DMV statutes, patients with seizures are not allowed to drive until  they have been seizure-free for six months. Use caution when using heavy equipment or power tools. Avoid working on ladders or at heights. Take showers instead of baths. Ensure the water temperature is not too high on the home water heater. Do not go swimming alone. When caring for infants or small children, sit down when holding, feeding, or changing them to minimize risk of injury to the child in the event you have a seizure. Please follow up with your neurologist Dr. Gregg.

## 2023-08-14 ENCOUNTER — Institutional Professional Consult (permissible substitution): Payer: Medicaid Other | Admitting: Diagnostic Neuroimaging

## 2023-08-18 ENCOUNTER — Other Ambulatory Visit: Payer: Self-pay | Admitting: Neurology

## 2023-08-21 ENCOUNTER — Other Ambulatory Visit: Payer: Self-pay | Admitting: Neurology

## 2023-09-08 ENCOUNTER — Ambulatory Visit: Payer: Medicaid Other | Admitting: Internal Medicine

## 2023-09-11 ENCOUNTER — Other Ambulatory Visit: Payer: Self-pay | Admitting: Neurology

## 2023-09-14 ENCOUNTER — Other Ambulatory Visit: Payer: Self-pay | Admitting: Neurology

## 2023-09-25 ENCOUNTER — Ambulatory Visit: Payer: Medicaid Other | Admitting: Diagnostic Neuroimaging

## 2023-09-25 ENCOUNTER — Encounter: Payer: Self-pay | Admitting: Diagnostic Neuroimaging

## 2023-09-25 VITALS — BP 125/84 | HR 100 | Ht 61.0 in | Wt 181.6 lb

## 2023-09-25 DIAGNOSIS — R4689 Other symptoms and signs involving appearance and behavior: Secondary | ICD-10-CM

## 2023-09-25 DIAGNOSIS — R202 Paresthesia of skin: Secondary | ICD-10-CM | POA: Diagnosis not present

## 2023-09-25 DIAGNOSIS — R200 Anesthesia of skin: Secondary | ICD-10-CM

## 2023-09-25 DIAGNOSIS — R519 Headache, unspecified: Secondary | ICD-10-CM | POA: Diagnosis not present

## 2023-09-25 NOTE — Patient Instructions (Addendum)
  

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
## 2023-10-09 ENCOUNTER — Emergency Department (HOSPITAL_COMMUNITY)
Admission: EM | Admit: 2023-10-09 | Discharge: 2023-10-09 | Disposition: A | Discharge status: Home / Self Care | Attending: Emergency Medicine | Admitting: Emergency Medicine

## 2023-10-09 ENCOUNTER — Emergency Department (HOSPITAL_COMMUNITY)

## 2023-10-09 ENCOUNTER — Encounter (HOSPITAL_COMMUNITY): Payer: Self-pay

## 2023-10-09 DIAGNOSIS — M533 Sacrococcygeal disorders, not elsewhere classified: Secondary | ICD-10-CM | POA: Insufficient documentation

## 2023-10-09 DIAGNOSIS — R251 Tremor, unspecified: Secondary | ICD-10-CM | POA: Insufficient documentation

## 2023-10-09 DIAGNOSIS — R197 Diarrhea, unspecified: Secondary | ICD-10-CM | POA: Insufficient documentation

## 2023-10-09 DIAGNOSIS — R1030 Lower abdominal pain, unspecified: Secondary | ICD-10-CM | POA: Insufficient documentation

## 2023-10-09 DIAGNOSIS — R112 Nausea with vomiting, unspecified: Secondary | ICD-10-CM | POA: Diagnosis present

## 2023-10-09 LAB — CBC WITH DIFFERENTIAL/PLATELET
Abs Immature Granulocytes: 0.01 10*3/uL (ref 0.00–0.07)
Basophils Absolute: 0 10*3/uL (ref 0.0–0.1)
Basophils Relative: 1 %
Eosinophils Absolute: 0 10*3/uL (ref 0.0–0.5)
Eosinophils Relative: 1 %
HCT: 43.2 % (ref 36.0–46.0)
Hemoglobin: 15.2 g/dL — ABNORMAL HIGH (ref 12.0–15.0)
Immature Granulocytes: 0 %
Lymphocytes Relative: 29 %
Lymphs Abs: 1.2 10*3/uL (ref 0.7–4.0)
MCH: 36.1 pg — ABNORMAL HIGH (ref 26.0–34.0)
MCHC: 35.2 g/dL (ref 30.0–36.0)
MCV: 102.6 fL — ABNORMAL HIGH (ref 80.0–100.0)
Monocytes Absolute: 0.2 10*3/uL (ref 0.1–1.0)
Monocytes Relative: 5 %
Neutro Abs: 2.6 10*3/uL (ref 1.7–7.7)
Neutrophils Relative %: 64 %
Platelets: 236 10*3/uL (ref 150–400)
RBC: 4.21 MIL/uL (ref 3.87–5.11)
RDW: 14.1 % (ref 11.5–15.5)
WBC: 4 10*3/uL (ref 4.0–10.5)
nRBC: 0 % (ref 0.0–0.2)

## 2023-10-09 LAB — COMPREHENSIVE METABOLIC PANEL WITH GFR
ALT: 22 U/L (ref 0–44)
AST: 26 U/L (ref 15–41)
Albumin: 4.3 g/dL (ref 3.5–5.0)
Alkaline Phosphatase: 70 U/L (ref 38–126)
Anion gap: 13 (ref 5–15)
BUN: 10 mg/dL (ref 6–20)
CO2: 18 mmol/L — ABNORMAL LOW (ref 22–32)
Calcium: 9.8 mg/dL (ref 8.9–10.3)
Chloride: 108 mmol/L (ref 98–111)
Creatinine, Ser: 0.73 mg/dL (ref 0.44–1.00)
GFR, Estimated: >60 mL/min (ref >60)
Glucose, Bld: 118 mg/dL — ABNORMAL HIGH (ref 70–99)
Potassium: 3.4 mmol/L — ABNORMAL LOW (ref 3.5–5.1)
Sodium: 139 mmol/L (ref 135–145)
Total Bilirubin: 0.7 mg/dL (ref 0.0–1.2)
Total Protein: 7.2 g/dL (ref 6.5–8.1)

## 2023-10-09 LAB — URINALYSIS, W/ REFLEX TO CULTURE (INFECTION SUSPECTED)
Bacteria, UA: NONE SEEN
Glucose, UA: NEGATIVE mg/dL
Ketones, ur: 20 mg/dL — AB
Leukocytes,Ua: NEGATIVE
Nitrite: NEGATIVE
Protein, ur: 100 mg/dL — AB
Specific Gravity, Urine: 1.026 (ref 1.005–1.030)
pH: 6 (ref 5.0–8.0)

## 2023-10-09 LAB — HCG, SERUM, QUALITATIVE: Preg, Serum: NEGATIVE

## 2023-10-09 LAB — ETHANOL: Alcohol, Ethyl (B): <10 mg/dL (ref <10)

## 2023-10-09 MED ORDER — ONDANSETRON 4 MG PO TBDP: ORAL_TABLET | ORAL | 0 refills | Status: AC

## 2023-10-09 MED ORDER — LORAZEPAM 2 MG/ML IJ SOLN
1.0000 mg | Freq: Once | INTRAMUSCULAR | Status: AC
  Administered 2023-10-09: 1 mg via INTRAVENOUS
  Filled 2023-10-09: qty 1

## 2023-10-09 MED ORDER — LACTATED RINGERS IV BOLUS
1000.0000 mL | Freq: Once | INTRAVENOUS | Status: AC
  Administered 2023-10-09: 1000 mL via INTRAVENOUS

## 2023-10-09 NOTE — ED Notes (Signed)
 Patient declined crackers but able to keep PO fluids down with GI upset. MD notified.
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
## 2023-10-09 NOTE — ED Triage Notes (Signed)
 According to [facility]: Pt is withdrawing from alcohol after quitting 3 days ago. Pt has been experiencing nausea/vomiting/diarrhea along with chronic back pain.   Pt kept throwing arms when bp running.   Bp 132/88  HR 70-90 arythmia not afib 100% room air Cbg 120 14-16 to 50RR when crying  20 gauge piv in left wrist which she received 20mg  of zofran through.

## 2023-10-09 NOTE — ED Notes (Signed)
 Patient discharged home. VSS. Patient ambulatory out of treatment area with clean and steady gait. All questions answered at time of discharge. Belongings sent home with patient.

## 2023-10-19 ENCOUNTER — Other Ambulatory Visit: Payer: Self-pay | Admitting: Neurology
